# Patient Record
Sex: Male | Born: 1960 | Race: White | Hispanic: No | Marital: Married | Smoking: Never smoker
Health system: Southern US, Community
[De-identification: ages and names within clinical notes are randomized; demographics above are authoritative.]

## PROBLEM LIST (undated history)

## (undated) DIAGNOSIS — T8859XA Other complications of anesthesia, initial encounter: Secondary | ICD-10-CM

## (undated) DIAGNOSIS — T7840XA Allergy, unspecified, initial encounter: Secondary | ICD-10-CM

## (undated) DIAGNOSIS — G709 Myoneural disorder, unspecified: Secondary | ICD-10-CM

## (undated) DIAGNOSIS — M1990 Unspecified osteoarthritis, unspecified site: Secondary | ICD-10-CM

## (undated) DIAGNOSIS — N189 Chronic kidney disease, unspecified: Secondary | ICD-10-CM

## (undated) DIAGNOSIS — F419 Anxiety disorder, unspecified: Secondary | ICD-10-CM

## (undated) DIAGNOSIS — T4145XA Adverse effect of unspecified anesthetic, initial encounter: Secondary | ICD-10-CM

## (undated) DIAGNOSIS — Z87442 Personal history of urinary calculi: Secondary | ICD-10-CM

## (undated) HISTORY — DX: Myoneural disorder, unspecified: G70.9

## (undated) HISTORY — DX: Anxiety disorder, unspecified: F41.9

## (undated) HISTORY — DX: Chronic kidney disease, unspecified: N18.9

## (undated) HISTORY — PX: POLYPECTOMY: SHX149

## (undated) HISTORY — DX: Allergy, unspecified, initial encounter: T78.40XA

---

## 1985-10-16 HISTORY — PX: OTHER SURGICAL HISTORY: SHX169

## 1986-02-17 HISTORY — PX: WRIST SURGERY: SHX841

## 1998-06-12 ENCOUNTER — Emergency Department (HOSPITAL_COMMUNITY): Admission: EM | Admit: 1998-06-12 | Discharge: 1998-06-12 | Payer: Self-pay | Admitting: Emergency Medicine

## 2006-03-26 ENCOUNTER — Emergency Department (HOSPITAL_COMMUNITY): Admission: EM | Admit: 2006-03-26 | Discharge: 2006-03-26 | Payer: Self-pay | Admitting: Family Medicine

## 2006-03-29 ENCOUNTER — Emergency Department (HOSPITAL_COMMUNITY): Admission: EM | Admit: 2006-03-29 | Discharge: 2006-03-29 | Payer: Self-pay | Admitting: Family Medicine

## 2008-12-27 ENCOUNTER — Emergency Department (HOSPITAL_COMMUNITY): Admission: EM | Admit: 2008-12-27 | Discharge: 2008-12-27 | Payer: Self-pay | Admitting: Emergency Medicine

## 2010-04-13 ENCOUNTER — Ambulatory Visit: Payer: Self-pay | Admitting: Internal Medicine

## 2010-04-13 DIAGNOSIS — F329 Major depressive disorder, single episode, unspecified: Secondary | ICD-10-CM | POA: Insufficient documentation

## 2010-04-13 DIAGNOSIS — R079 Chest pain, unspecified: Secondary | ICD-10-CM | POA: Insufficient documentation

## 2010-04-27 ENCOUNTER — Telehealth: Payer: Self-pay | Admitting: Internal Medicine

## 2010-05-01 ENCOUNTER — Encounter: Payer: Self-pay | Admitting: Internal Medicine

## 2010-05-12 ENCOUNTER — Telehealth: Payer: Self-pay | Admitting: Internal Medicine

## 2010-07-23 IMAGING — CT CT HEAD W/O CM
1 of 2 series · 16 of 30 positions shown, 20 images · non-contrast
Comparison: None

CLINICAL DATA: Fell.

CT HEAD WITHOUT CONTRAST
TECHNIQUE: Contiguous axial images were obtained from the base of
the skull through the vertex without contrast.

[Series 3: recon 2: brain · axial · 0.47mm/px · z∈[+148,+291]mm · 16 of 80 slices shown, 20 images]
[im 5/80  brain]
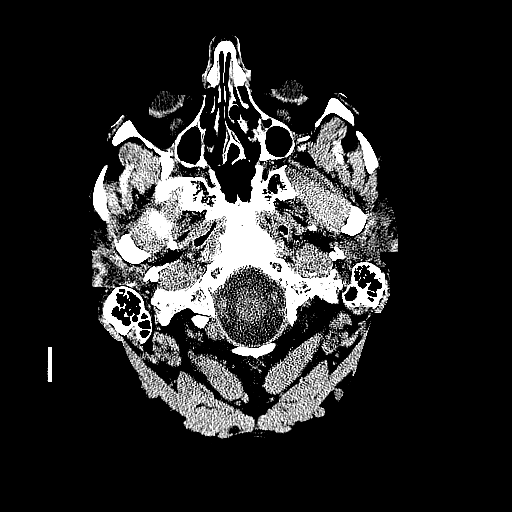
[im 5/80  bone]
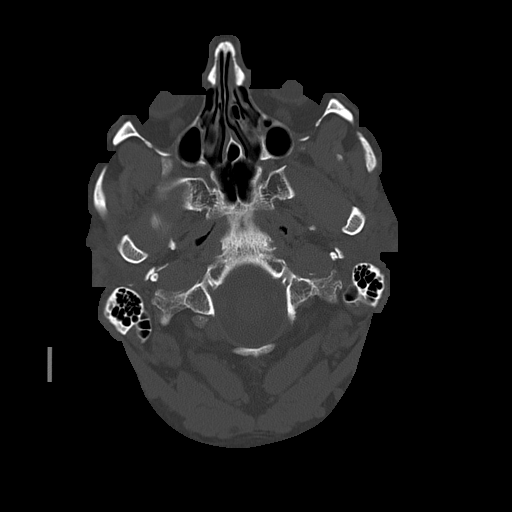
[im 9/80  brain]
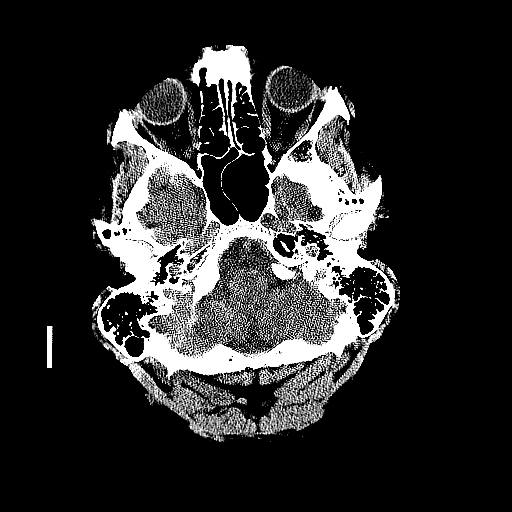
[im 13/80  brain]
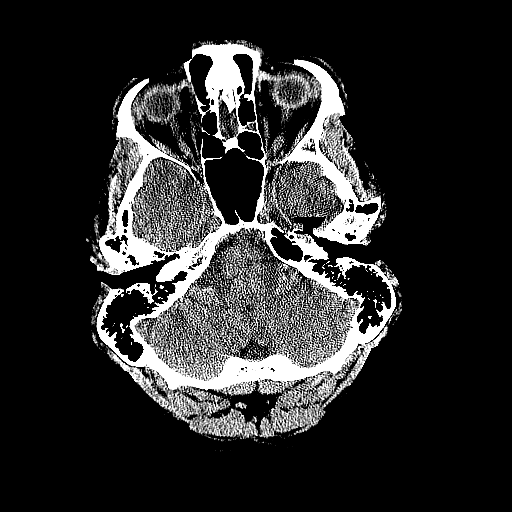
[im 17/80  brain]
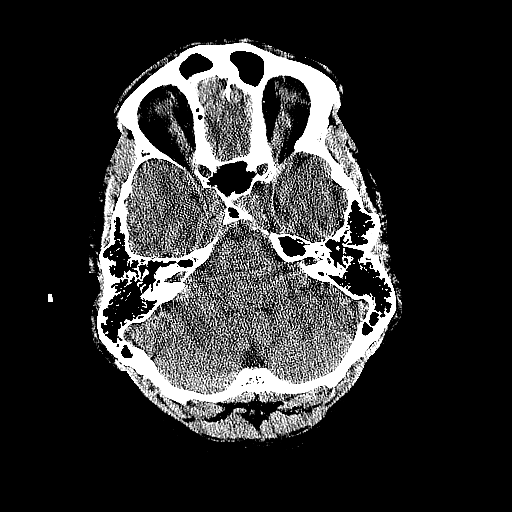
[im 25/80  brain]
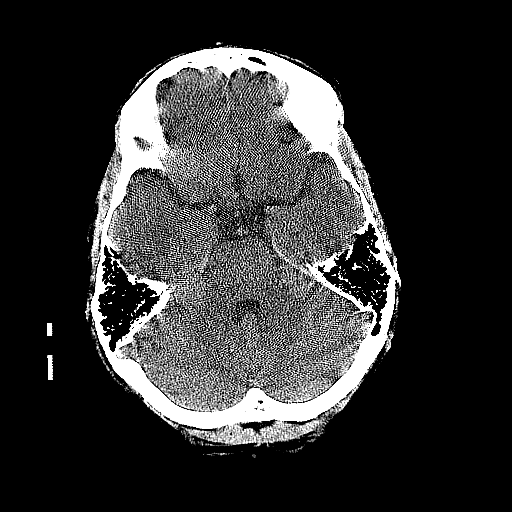
[im 25/80  bone]
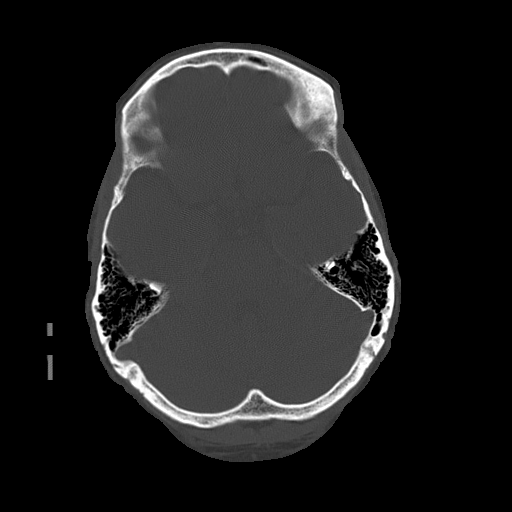
[im 30/80  brain]
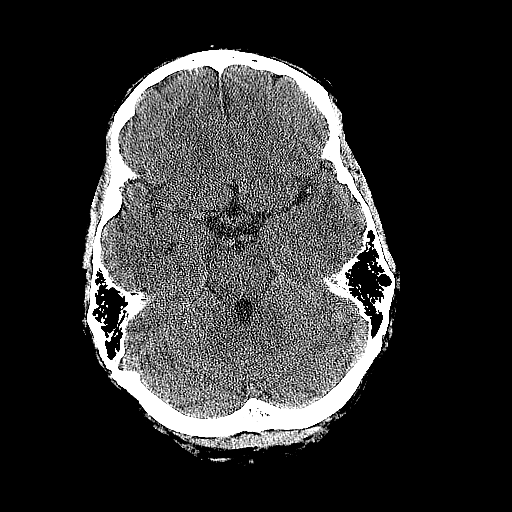
[im 34/80  brain]
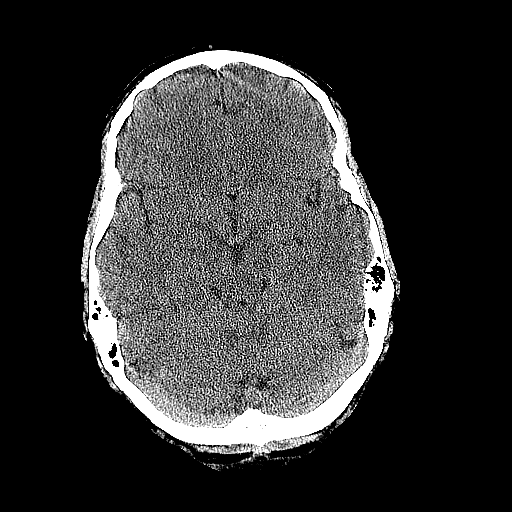
[im 38/80  brain]
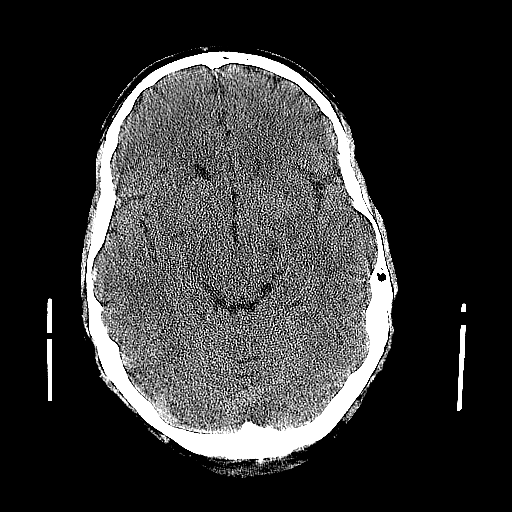
[im 42/80  brain]
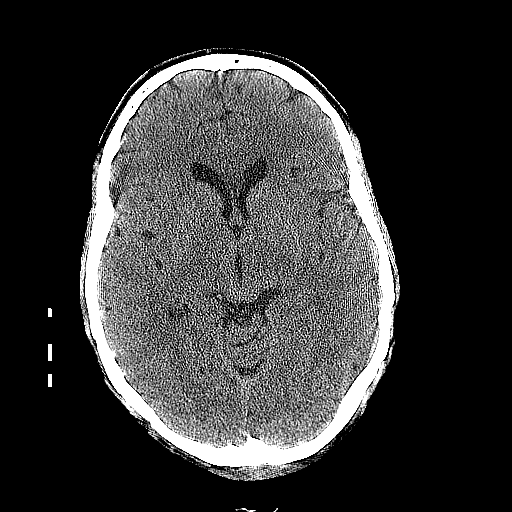
[im 42/80  bone]
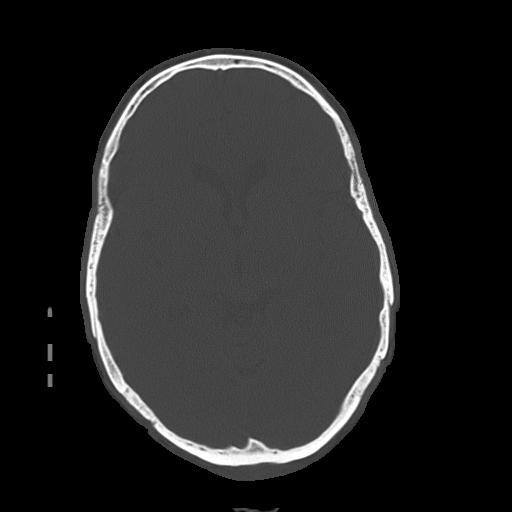
[im 46/80  brain]
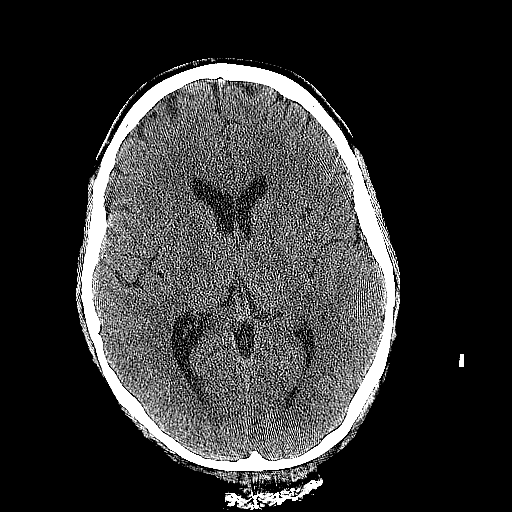
[im 50/80  brain]
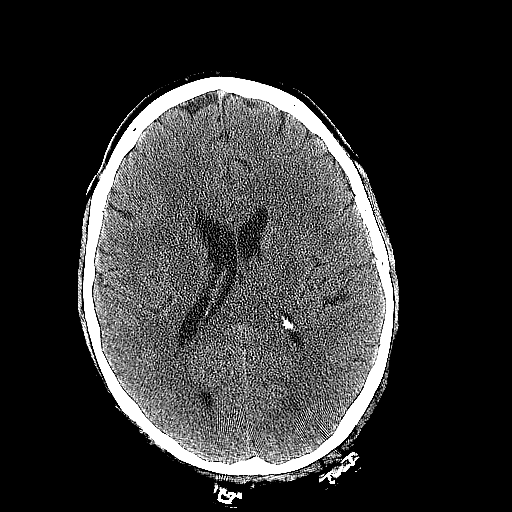
[im 55/80  brain]
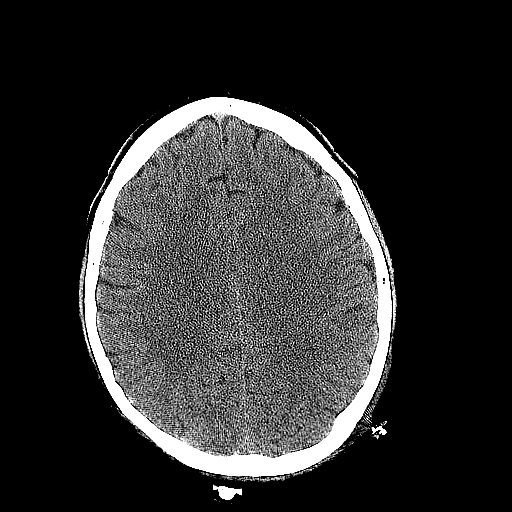
[im 63/80  brain]
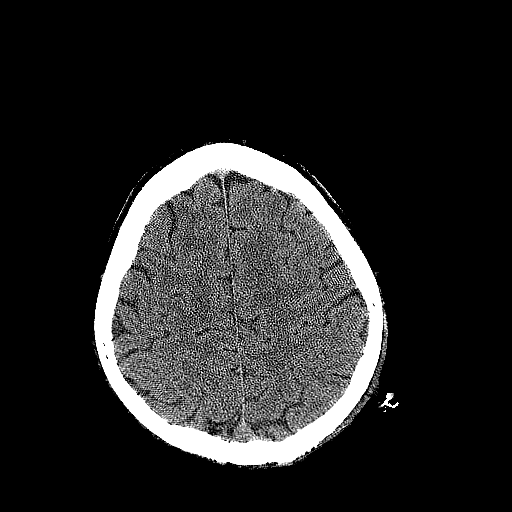
[im 63/80  bone]
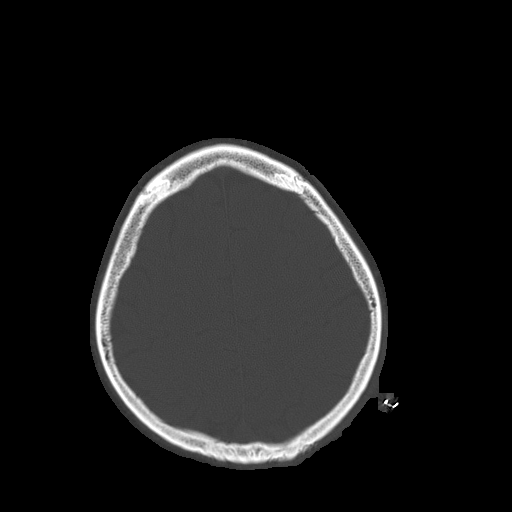
[im 67/80  brain]
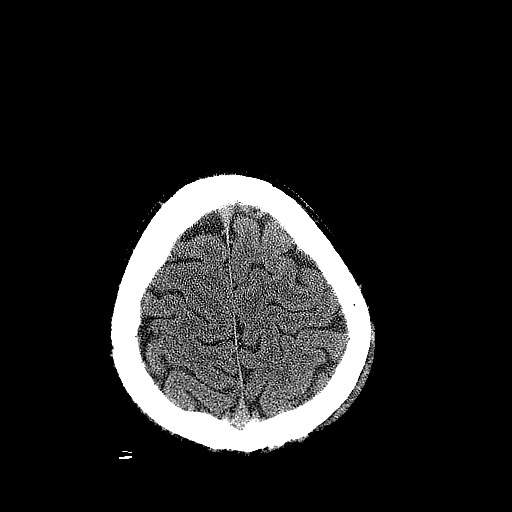
[im 71/80  brain]
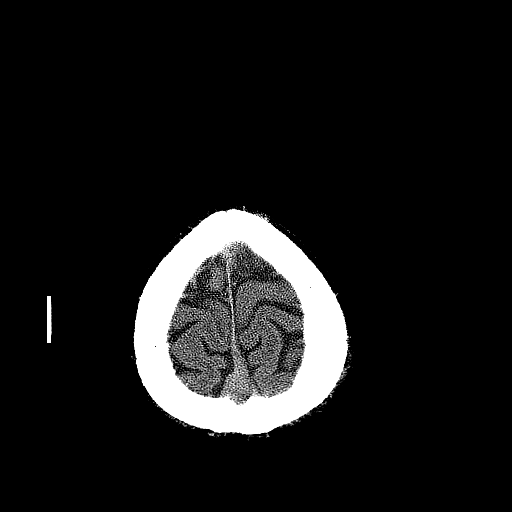
[im 75/80  brain]
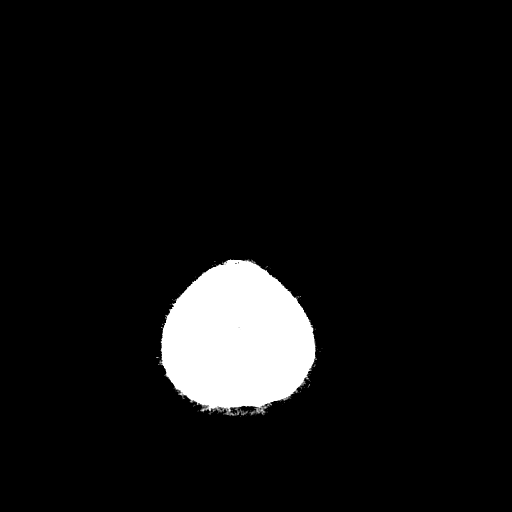

[16 of 30 positions shown; findings below may reference images not displayed]

FINDINGS: The ventricles are normal.  No extra-axial fluid
collections are seen.  The brainstem and cerebellum are
unremarkable.  No acute intracranial findings or mass lesions.

The bony calvarium is intact.  The visualized paranasal sinuses and
mastoid air cells are clear. There is a high left posterior
parietal and post occipital skull laceration.
IMPRESSION: No acute intracranial findings or skull fracture.

## 2010-08-16 ENCOUNTER — Encounter: Payer: Self-pay | Admitting: Internal Medicine

## 2010-11-15 NOTE — Letter (Signed)
Summary: Health Questionaire/[facility]  Health Questionaire/[facility]   Imported By: Sherian Rein 04/14/2010 14:31:59  _____________________________________________________________________  External Attachment:    Type:   Image     Comment:   External Document

## 2010-11-15 NOTE — Letter (Signed)
Summary: [facility] Referral (unable to schedule) Notification  [facility]  [address] [place], [place] [address]   Phone: [phone]  Fax: [phone]      August 16, 2010 Roger Wagner Sep 30, 1961 MRN: [id number]   [facility] [address] [place], [place]  [address]   Dear Dr. Yetta Barre:   Thank you for your kind referral of the above patient. We have attempted to schedule the recommended Colonoscopy but have been unable to schedule because:  _x_ The patient was not available by phone and/or has not returned our calls.  __ The patient declined to schedule the procedure at this time.  We appreciate the referral and hope that we will have the opportunity to treat this patient in the future.    Sincerely,   [facility]  Vania Rea. Jarold Motto M.D. Hedwig Morton. Juanda Chance M.D. Venita Lick. Russella Dar M.D. Wilhemina Bonito. Marina Goodell M.D. Barbette Hair. Arlyce Dice M.D. Iva Boop M.D. Cheron Every.D.

## 2010-11-15 NOTE — Progress Notes (Signed)
Summary: Lab Dx code  Phone Note Call from Patient   Summary of Call: Only one dx was assigned to last labs. What other dx can be used to cover labs. Patient is requesting that we use cpx code. V70.0. OK?   [facility] # [phone] [id number] Initial call taken by: Lamar Sprinkles, CMA,  May 12, 2010 3:47 PM  Follow-up for Phone Call        yes Follow-up by: Etta Grandchild MD,  May 12, 2010 3:51 PM  Additional Follow-up for Phone Call Additional follow up Details #1::        Spoke w/[facility]. They filed last labs w/primary dx 311 and secondary dx v70.0. Should be primary v70.0. They will refile claim but will most likely not change the co-insurance pt has to pay. Pt informed  Additional Follow-up by: Lamar Sprinkles, CMA,  May 13, 2010 6:07 PM

## 2010-11-15 NOTE — Progress Notes (Signed)
Summary: LAB Results  Phone Note Call from Patient Call back at Home Phone [phone] Call back at [phone] - WIFE   Summary of Call: Patient is requesting results of labs. I see order in EMR at date of last office visit. No labs resulted. Need to call pt  to confirm they had labs the day of office visit. Initial call taken by: Lamar Sprinkles, CMA,  April 27, 2010 3:16 PM  Follow-up for Phone Call        Left vm for pt to call back and confirm when & where he had labs. ............Marland KitchenLamar Sprinkles, CMA  April 27, 2010 3:34 PM   Pt called back. They had labs thru [facility] and were supposed to fax results. Will need to contact [facility]. ........Marland KitchenLamar Sprinkles, CMA  April 28, 2010 12:03 PM   Called [facility], they will fax results from 04/13/10 to side a fax Follow-up by: Lamar Sprinkles, CMA,  April 28, 2010 4:21 PM  Additional Follow-up for Phone Call Additional follow up Details #1::        Labs recieved. Pt aware MD will review when he returns to the office Monday................Marland KitchenLamar Sprinkles, CMA  April 28, 2010 5:21 PM

## 2010-11-15 NOTE — Letter (Signed)
Summary: Results Follow-up Letter  [facility]  [address] [place], [place] [address]   Phone: [phone]  Fax: [phone]    05/01/2010  [address] [place], [place]  [address]  Dear Mr. Adel,   The following are the results of your recent test(s):  Test     Result     CBC       normal Liver/kidney   normal Thyroid     normal Prostate     normal   _________________________________________________________  Please call for an appointment as needed _________________________________________________________ _________________________________________________________ _________________________________________________________  Sincerely,  Sanda Linger MD [facility]

## 2010-11-15 NOTE — Assessment & Plan Note (Signed)
Summary: NEW [employer] PT # PKG/OFF--STC   Vital Signs:  Patient profile:   50 year old male Height:      72 inches Weight:      195 pounds BMI:     26.54 O2 Sat:      97 % on Room air Temp:     98.2 degrees F oral Pulse rate:   62 / minute Pulse rhythm:   regular Resp:     16 per minute BP sitting:   102 / 70  (left arm) Cuff size:   large  Vitals Entered By: Rock Nephew CMA (April 13, 2010 10:37 AM)  Nutrition Counseling: Patient's BMI is greater than 25 and therefore counseled on weight management options.  O2 Flow:  Room air CC: L side arm and chest pain, rhinks anxiety and stress Is Patient Diabetic? No   Primary Care Provider:  Etta Grandchild MD  CC:  L side arm and chest pain and rhinks anxiety and stress.  History of Present Illness:       This is a 50 year old male who presents with Chest pain.  The symptoms began 4-8 weeks ago.  On a scale of 1 to 10, the intensity is described as a 2.  The patient reports resting chest pain, but denies exertional chest pain, nausea, vomiting, diaphoresis, shortness of breath, palpitations, dizziness, light headedness, syncope, and indigestion.  The pain is described as intermittent and dull.  The pain is located in the left anterior chest and the pain does not radiate.  Episodes of chest pain last 2-5 minutes.  The pain is brought on or made worse by emotional stress.  His wife has given him a few doses of her mother's xanax and that has helped with the chest pain and anxiety.  Preventive Screening-Counseling & Management  Alcohol-Tobacco     Alcohol drinks/day: <1     Alcohol type: beer     >5/day in last 3 mos: no     Alcohol Counseling: not indicated; use of alcohol is not excessive or problematic     Feels need to cut down: no     Feels annoyed by complaints: no     Feels guilty re: drinking: no     Needs 'eye opener' in am: no     Smoking Status: never  Caffeine-Diet-Exercise     Does Patient Exercise: yes     Type of  exercise: running     Exercise (avg: min/session): 30-60     Times/week: 4     Exercise Counseling: not indicated; exercise is adequate     PHQ-9 Score: 1-4 minimal depression  Hep-HIV-STD-Contraception     TSE monthly: yes     Testicular SE Education/Counseling to perform regular STE     Sun Exposure-Excessive: yes     Sun Exposure Counseling: to decrease sun exposure  Safety-Violence-Falls     Seat Belt Use: yes     Helmet Use: yes     Firearms in the Home: no firearms in the home     Smoke Detectors: yes     Violence in the Home: no risk noted     Sexual Abuse: no      Sexual History:  currently monogamous.        Drug Use:  never and no.        Blood Transfusions:  no.    Current Medications (verified): 1)  None  Allergies (verified): No Known Drug Allergies  Past History:  Past Medical History: Unremarkable  Past Surgical History: Denies surgical history  Family History: Family History of Colon CA 1st degree relative <60 Family History Diabetes 1st degree relative Family History of Stroke F 1st degree relative <60  Social History: Occupation: Airline pilot Married Never Smoked Alcohol use-yes Drug use-no Regular exercise-yes Smoking Status:  never Does Patient Exercise:  yes Drug Use:  never, no Sun Exposure-Excessive:  yes Seat Belt Use:  yes Sexual History:  currently monogamous Blood Transfusions:  no  Review of Systems       The patient complains of chest pain.  The patient denies anorexia, fever, weight loss, weight gain, syncope, dyspnea on exertion, peripheral edema, prolonged cough, headaches, hemoptysis, abdominal pain, hematochezia, hematuria, suspicious skin lesions, enlarged lymph nodes, angioedema, and testicular masses.   Psych:  Complains of anxiety, depression, irritability, and panic attacks; denies easily angered, easily tearful, sense of great danger, suicidal thoughts/plans, thoughts of violence, unusual visions or sounds, and thoughts  /plans of harming others.  Physical Exam  General:  alert, well-developed, well-nourished, well-hydrated, appropriate dress, normal appearance, healthy-appearing, cooperative to examination, and good hygiene.   Head:  normocephalic, atraumatic, no abnormalities observed, and no abnormalities palpated.   Eyes:  vision grossly intact and no injection.   Ears:  R ear normal and L ear normal.   Mouth:  Oral mucosa and oropharynx without lesions or exudates.  Teeth in good repair. Neck:  supple, full ROM, no masses, no thyromegaly, no thyroid nodules or tenderness, no JVD, normal carotid upstroke, no carotid bruits, no cervical lymphadenopathy, and no neck tenderness.   Chest Wall:  No deformities, masses, tenderness or gynecomastia noted. Breasts:  No masses or gynecomastia noted Lungs:  Normal respiratory effort, chest expands symmetrically. Lungs are clear to auscultation, no crackles or wheezes. Heart:  Normal rate and regular rhythm. S1 and S2 normal without gallop, murmur, click, rub or other extra sounds. Abdomen:  Bowel sounds positive,abdomen soft and non-tender without masses, organomegaly or hernias noted. Rectal:  No external abnormalities noted. Normal sphincter tone. No rectal masses or tenderness. Genitalia:  Testes bilaterally descended without nodularity, tenderness or masses. No scrotal masses or lesions. No penis lesions or urethral discharge. Prostate:  Prostate gland firm and smooth, no enlargement, nodularity, tenderness, mass, asymmetry or induration. Msk:  No deformity or scoliosis noted of thoracic or lumbar spine.   Pulses:  R and L carotid,radial,femoral,dorsalis pedis and posterior tibial pulses are full and equal bilaterally Extremities:  No clubbing, cyanosis, edema, or deformity noted with normal full range of motion of all joints.   Neurologic:  No cranial nerve deficits noted. Station and gait are normal. Plantar reflexes are down-going bilaterally. DTRs are  symmetrical throughout. Sensory, motor and coordinative functions appear intact. Skin:  turgor normal, no rashes, no suspicious lesions, no ecchymoses, no petechiae, no purpura, no ulcerations, no edema, excessive tan, and solar damage.   Cervical Nodes:  no anterior cervical adenopathy and no posterior cervical adenopathy.   Axillary Nodes:  no R axillary adenopathy and no L axillary adenopathy.   Inguinal Nodes:  no R inguinal adenopathy and no L inguinal adenopathy.   Psych:  Oriented X3, memory intact for recent and remote, normally interactive, good eye contact, not depressed appearing, not agitated, not suicidal, not homicidal, and slightly anxious.   Additional Exam:  EKG is normal.   Impression & Recommendations:  Problem # 1:  CHEST PAIN (ICD-786.50) Assessment New there is no indication that this is cardiac, will treat anxiety  and follow Orders: EKG w/ Interpretation (93000)  Problem # 2:  DEPRESSIVE DISORDER (ICD-311) Assessment: New  His updated medication list for this problem includes:    Zoloft 50 Mg Tabs (Sertraline hcl) ..... One by mouth at bedtime    Klonopin 1 Mg Tabs (Clonazepam) .Marland Kitchen... 1/2-1 by mouth two times a day as needed for anxiety  Orders: Venipuncture ([address]) TLB-Lipid Panel (80061-LIPID) TLB-BMP (Basic Metabolic Panel-BMET) (80048-METABOL) TLB-CBC Platelet - w/Differential (85025-CBCD) TLB-Hepatic/Liver Function Pnl (80076-HEPATIC) TLB-TSH (Thyroid Stimulating Hormone) (84443-TSH) TLB-PSA (Prostate Specific Antigen) (84153-PSA)  Discussed treatment options, including trial of antidpressant medication. Will refer to behavioral health. Follow-up call in in 24-48 hours and recheck in 2 weeks, sooner as needed. Patient agrees to call if any worsening of symptoms or thoughts of doing harm arise. Verified that the patient has no suicidal ideation at this time.   Problem # 3:  ROUTINE GENERAL MEDICAL EXAM@[email or web address]  CARE FACL (ICD-V70.0) Assessment:  New  Orders: Venipuncture ([address]) TLB-Lipid Panel (80061-LIPID) TLB-BMP (Basic Metabolic Panel-BMET) (80048-METABOL) TLB-CBC Platelet - w/Differential (85025-CBCD) TLB-Hepatic/Liver Function Pnl (80076-HEPATIC) TLB-TSH (Thyroid Stimulating Hormone) (84443-TSH) TLB-PSA (Prostate Specific Antigen) (84153-PSA) Gastroenterology Referral (GI)  Td Booster: Tdap (12/14/2008)    Discussed using sunscreen, use of alcohol, drug use, self testicular exam, routine dental care, routine eye care, routine physical exam, seat belts, multiple vitamins,  and recommendations for immunizations.  Discussed exercise and checking cholesterol.  Discussed gun safety, safe sex, and contraception. Also recommend checking PSA.  Complete Medication List: 1)  Zoloft 50 Mg Tabs (Sertraline hcl) .... One by mouth at bedtime 2)  Klonopin 1 Mg Tabs (Clonazepam) .... 1/2-1 by mouth two times a day as needed for anxiety  Patient Instructions: 1)  Please schedule a follow-up appointment in 2 months. Prescriptions: KLONOPIN 1 MG TABS (CLONAZEPAM) 1/2-1 by mouth two times a day as needed for anxiety  #50 x 2   Entered and Authorized by:   Etta Grandchild MD   Signed by:   Etta Grandchild MD on 04/13/2010   Method used:   Print then Give to Patient   RxID:   [id number] ZOLOFT 50 MG TABS (SERTRALINE HCL) One by mouth at bedtime  #30 x 11   Entered and Authorized by:   Etta Grandchild MD   Signed by:   Etta Grandchild MD on 04/13/2010   Method used:   Print then Give to Patient   RxID:   [id number]   Preventive Care Screening  Last Tetanus Booster:    Date:  12/14/2008    Results:  Tdap

## 2010-11-15 NOTE — Letter (Signed)
Summary: Lipid Letter  [facility]  [address] [place], [place] [address]   Phone: [phone]  Fax: [phone]    05/01/2010  Roger Wagner [address] [place], [place]  [address]  Dear Roger Wagner:  We have carefully reviewed your last lipid profile from  and the results are noted below with a summary of recommendations for lipid management.    Cholesterol:       189     Goal: <200   HDL "good" Cholesterol:   57     Goal: >40   LDL "bad" Cholesterol:   110     Goal: <130   Triglycerides:       111     Goal: <150    Excellent results!!!!!!    TLC Diet (Therapeutic Lifestyle Change): Saturated Fats & Transfatty acids should be kept < 7% of total calories ***Reduce Saturated Fats Polyunstaurated Fat can be up to 10% of total calories Monounsaturated Fat Fat can be up to 20% of total calories Total Fat should be no greater than 25-35% of total calories Carbohydrates should be 50-60% of total calories Protein should be approximately 15% of total calories Fiber should be at least 20-30 grams a day ***Increased fiber may help lower LDL Total Cholesterol should be < 200mg /day Consider adding plant stanol/sterols to diet (example: Benacol spread) ***A higher intake of unsaturated fat may reduce Triglycerides and Increase HDL    Adjunctive Measures (may lower LIPIDS and reduce risk of Heart Attack) include: Aerobic Exercise (20-30 minutes 3-4 times a week) Limit Alcohol Consumption Weight Reduction Aspirin 75-81 mg a day by mouth (if not allergic or contraindicated) Dietary Fiber 20-30 grams a day by mouth     Current Medications: 1)    Zoloft 50 Mg Tabs (Sertraline hcl) .... One by mouth at bedtime 2)    Klonopin 1 Mg Tabs (Clonazepam) .... 1/2-1 by mouth two times a day as needed for anxiety  If you have any questions, please call. We appreciate being able to work with you.   Sincerely,    [facility] Etta Grandchild MD

## 2010-11-29 ENCOUNTER — Encounter: Payer: Self-pay | Admitting: Internal Medicine

## 2010-11-29 ENCOUNTER — Ambulatory Visit (INDEPENDENT_AMBULATORY_CARE_PROVIDER_SITE_OTHER): Payer: PRIVATE HEALTH INSURANCE | Admitting: Internal Medicine

## 2010-11-29 DIAGNOSIS — K612 Anorectal abscess: Secondary | ICD-10-CM

## 2010-11-30 ENCOUNTER — Encounter: Payer: Self-pay | Admitting: Internal Medicine

## 2010-11-30 ENCOUNTER — Ambulatory Visit (INDEPENDENT_AMBULATORY_CARE_PROVIDER_SITE_OTHER): Payer: PRIVATE HEALTH INSURANCE | Admitting: Internal Medicine

## 2010-11-30 ENCOUNTER — Ambulatory Visit: Payer: Self-pay | Admitting: Internal Medicine

## 2010-11-30 DIAGNOSIS — K612 Anorectal abscess: Secondary | ICD-10-CM

## 2010-11-30 DIAGNOSIS — F329 Major depressive disorder, single episode, unspecified: Secondary | ICD-10-CM

## 2010-12-07 NOTE — Assessment & Plan Note (Signed)
Summary: ??boil on rectum   Vital Signs:  Patient profile:   50 year old male Height:      72 inches Weight:      198 pounds BMI:     26.95 O2 Sat:      97 % on Room air Temp:     98.4 degrees F oral Pulse rate:   73 / minute Pulse rhythm:   regular Resp:     16 per minute BP sitting:   118 / 70  (left arm) Cuff size:   large  Vitals Entered By: Rock Nephew CMA (November 29, 2010 10:07 AM)  O2 Flow:  Room air CC: painful bump on anus Is Patient Diabetic? No Pain Assessment Patient in pain? yes     Location: pelvis   Primary Care Provider:  Etta Grandchild MD  CC:  painful bump on anus.  History of Present Illness: He returns c/o a painful lump around his anus for 3 days. He has had something similar in the past and it was an abscess that was drained. He feels like this has recurred in the same area.  Preventive Screening-Counseling & Management  Alcohol-Tobacco     Alcohol drinks/day: <1     Alcohol type: beer     >5/day in last 3 mos: no     Alcohol Counseling: not indicated; use of alcohol is not excessive or problematic     Feels need to cut down: no     Feels annoyed by complaints: no     Feels guilty re: drinking: no     Needs 'eye opener' in am: no     Smoking Status: never     Tobacco Counseling: not indicated; no tobacco use  Hep-HIV-STD-Contraception     Hepatitis Risk: no risk noted     HIV Risk: no risk noted     STD Risk: no risk noted     TSE monthly: yes     Testicular SE Education/Counseling to perform regular STE     Sun Exposure-Excessive: yes     Sun Exposure Counseling: to decrease sun exposure      Sexual History:  currently monogamous.        Drug Use:  never and no.        Blood Transfusions:  no.    Medications Prior to Update: 1)  Zoloft 50 Mg Tabs (Sertraline Hcl) .... One By Mouth At Bedtime 2)  Klonopin 1 Mg Tabs (Clonazepam) .... 1/2-1 By Mouth Two Times A Day As Needed For Anxiety  Current Medications (verified): 1)   Zoloft 50 Mg Tabs (Sertraline Hcl) .... One By Mouth At Bedtime 2)  Klonopin 1 Mg Tabs (Clonazepam) .... 1/2-1 By Mouth Two Times A Day As Needed For Anxiety 3)  Augmentin 500-125 Mg Tabs (Amoxicillin-Pot Clavulanate) .... One By Mouth Three Times A Day For 7 Days 4)  Percocet 7.5-325 Mg Tabs (Oxycodone-Acetaminophen) .... One By Mouth Qid As Needed For Pain 5)  Promethazine Hcl 25 Mg Tabs (Promethazine Hcl) .... 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 Family History of Colon CA 1st degree relative <60 Family History Diabetes 1st degree relative Family History of Stroke F 1st degree relative <60  Social History: Reviewed history from 04/13/2010 and no changes required. 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     Physical Exam  General:  alert, well-developed, well-nourished, well-hydrated, appropriate dress, normal appearance, healthy-appearing, cooperative to examination, and good hygiene.   Head:  normocephalic, atraumatic, no abnormalities observed, and no abnormalities palpated.   Eyes:  vision grossly intact and no injection.   Neck:  supple, full ROM, no masses, no thyromegaly, no thyroid nodules or tenderness, no JVD, normal carotid upstroke, no carotid bruits, no cervical lymphadenopathy, and no neck tenderness.   Lungs:  Normal respiratory effort, chest expands symmetrically. Lungs are clear to auscultation, no crackles or wheezes. Heart:  Normal rate and regular rhythm. S1 and S2 normal without gallop, murmur, click, rub or other extra sounds. Abdomen:  Bowel sounds positive,abdomen soft and non-tender without masses, organomegaly or hernias noted. Rectal:  there is an erythematous protrusion noted at the upper/posterior/left perianal area outside the area. there is fluctuance and ttp. there are no tracts or pores. the digital rectal exam does not show any involvement of the canal with no ttp, erythema, mass, blood, exudate Genitalia:  Testes bilaterally descended without nodularity, tenderness or masses. No scrotal masses or lesions. No penis lesions or urethral discharge. Msk:  No deformity or scoliosis noted of thoracic or lumbar spine.   Skin:  turgor normal, no rashes, no suspicious lesions, no ecchymoses, no petechiae, no purpura, no ulcerations, no edema, excessive tan, and solar damage.   Psych:  Oriented X3, memory intact for recent and remote, normally interactive, good eye contact, not depressed appearing, not agitated, not suicidal, not homicidal, and slightly anxious.   Additional Exam:  the perianal area was cleaned with betadine then prepped and draped in sterile fashion. local anesthesia was obtained with 2% plain lidocaine, when anesthesia was confirmed an 11-blade was used to  make an incision and a copious amount of purulent exudate was released, the incision was explored and a loculation was found and it was irrigated with H2O2 then packed with iodoform. He tolerated all of this well. there was very minimal blood loss. sterile gauze was placed over the wound and hemostasis was obtained.   Impression & Recommendations:  Problem # 1:  ABSCESS-ANORECTAL (ICD-566) Assessment New start augmentin and control pain and side effects Orders: T-Culture, Wound (87070/[id number]) Specimen Handling (16109) I&D Abscess, Complex (10061)  Complete Medication List: 1)  Zoloft 50 Mg Tabs (Sertraline hcl) .... One by mouth at bedtime 2)  Klonopin 1 Mg Tabs (Clonazepam) .... 1/2-1 by mouth two times a day as needed for anxiety 3)  Augmentin 500-125 Mg Tabs (Amoxicillin-pot clavulanate) .... One by mouth three times a day for 7 days 4)  Percocet 7.5-325 Mg Tabs (Oxycodone-acetaminophen) .... One by mouth qid as needed  for pain 5)  Promethazine Hcl 25 Mg Tabs (Promethazine hcl) .... 1/2-1 by mouth three times a day as needed for nausea or vomiting  Patient Instructions: 1)  Please schedule a follow-up appointment in 1 day for a recheck. 2)  Take your antibiotic as prescribed until ALL of it is gone, but stop if you develop a rash or swelling and contact our office as soon as possible. Prescriptions: PROMETHAZINE HCL 25 MG TABS (PROMETHAZINE HCL) 1/2-1 by mouth three times a day as needed for nausea or vomiting  #20 x 0   Entered and Authorized by:   Etta Grandchild MD   Signed by:   Etta Grandchild MD on 11/29/2010   Method used:   Print then Give to Patient   RxID:   [id number] PERCOCET 7.5-325 MG TABS (OXYCODONE-ACETAMINOPHEN) One by mouth QID as needed for pain  #20 x 0   Entered and Authorized by:   Etta Grandchild MD   Signed by:   Etta Grandchild MD on 11/29/2010   Method used:   Print then Give to Patient   RxID:   [id number] AUGMENTIN 500-125 MG TABS  (AMOXICILLIN-POT CLAVULANATE) One by mouth three times a day for 7 days  #21 x 0   Entered and Authorized by:   Etta Grandchild MD   Signed by:   Etta Grandchild MD on 11/29/2010   Method used:   Print then Give to Patient   RxID:   [id number]    Orders Added: 1)  T-Culture, Wound [87070/[id number]] 2)  Specimen Handling [99000] 3)  I&D Abscess, Complex [10061] 4)  Est. Patient Level III [53664]

## 2010-12-07 NOTE — Assessment & Plan Note (Signed)
Summary: 1 DAY RECHECK/NWS   Vital Signs:  Patient profile:   50 year old male Height:      72 inches Weight:      200 pounds O2 Sat:      98 % on Room air Temp:     98.0 degrees F oral Pulse rate:   56 / minute Pulse rhythm:   regular Resp:     16 per minute BP sitting:   132 / 82  (left arm) Cuff size:   regular  Vitals Entered By: Lamar Sprinkles, CMA (November 30, 2010 9:52 AM)  O2 Flow:  Room air CC: F/up Is Patient Diabetic? No Pain Assessment Patient in pain? no        Primary Care Provider:  Etta Grandchild MD  CC:  F/up.  History of Present Illness: He returns for f/up and tells me that he is doing much better with no pain, swelling, redness, bleeding, drainage, fever, or chills.  Preventive Screening-Counseling & Management  Alcohol-Tobacco     Alcohol drinks/day: <1     Alcohol type: beer     >5/day in last 3 mos: no     Alcohol Counseling: not indicated; use of alcohol is not excessive or problematic     Feels need to cut down: no     Feels annoyed by complaints: no     Feels guilty re: drinking: no     Needs 'eye opener' in am: no     Smoking Status: never     Tobacco Counseling: not indicated; no tobacco use  Hep-HIV-STD-Contraception     Hepatitis Risk: no risk noted     HIV Risk: no risk noted     STD Risk: no risk noted     TSE monthly: yes     Testicular SE Education/Counseling to perform regular STE     Sun Exposure-Excessive: yes     Sun Exposure Counseling: to decrease sun exposure      Sexual History:  currently monogamous.        Drug Use:  never and no.        Blood Transfusions:  no.    Medications Prior to Update: 1)  Zoloft 50 Mg Tabs (Sertraline Hcl) .... One By Mouth At Bedtime 2)  Klonopin 1 Mg Tabs (Clonazepam) .... 1/2-1 By Mouth Two Times A Day As Needed For Anxiety 3)  Augmentin 500-125 Mg Tabs (Amoxicillin-Pot Clavulanate) .... One By Mouth Three Times A Day For 7 Days 4)  Percocet 7.5-325 Mg Tabs  (Oxycodone-Acetaminophen) .... One By Mouth Qid As Needed For Pain 5)  Promethazine Hcl 25 Mg Tabs (Promethazine Hcl) .... 1/2-1 By Mouth Three Times A Day As Needed For Nausea or Vomiting  Current Medications (verified): 1)  Zoloft 50 Mg Tabs (Sertraline Hcl) .... One By Mouth At Bedtime 2)  Klonopin 1 Mg Tabs (Clonazepam) .... 1/2-1 By Mouth Two Times A Day As Needed For Anxiety 3)  Augmentin 500-125 Mg Tabs (Amoxicillin-Pot Clavulanate) .... One By Mouth Three Times A Day For 7 Days 4)  Percocet 7.5-325 Mg Tabs (Oxycodone-Acetaminophen) .... One By Mouth Qid As Needed For Pain 5)  Promethazine Hcl 25 Mg Tabs (Promethazine Hcl) .... 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 Family History of Colon CA 1st degree relative <60 Family History Diabetes 1st degree relative Family History of Stroke F 1st degree relative <60  Social History: Reviewed history from 04/13/2010 and no changes required. 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   Psych:  Complains of anxiety; denies alternate hallucination ( auditory/visual), depression, easily angered, easily tearful, irritability, mental problems, panic attacks, sense of great danger, suicidal thoughts/plans, thoughts of violence, unusual visions or sounds, and thoughts /plans of harming others.  Physical Exam  General:  alert, well-developed, well-nourished, well-hydrated, appropriate dress, normal appearance, healthy-appearing, cooperative to examination, and good hygiene.   Mouth:  Oral mucosa and oropharynx without lesions or exudates.  Teeth in good repair. Neck:  supple, full ROM, no masses, no thyromegaly, no thyroid nodules or tenderness, no JVD, normal carotid upstroke, no carotid bruits, no cervical lymphadenopathy, and no neck tenderness.   Lungs:  Normal respiratory effort, chest expands symmetrically. Lungs are clear to auscultation, no crackles or wheezes. Heart:  Normal rate and regular rhythm. S1 and S2 normal without gallop, murmur, click, rub or other extra sounds. Abdomen:  Bowel sounds positive,abdomen soft and non-tender without masses, organomegaly or hernias noted. Rectal:  there is small incision that is nearly completed closed at the upper/posterior/left perianal area outside the area. there is no fluctuance or  ttp. there are no tracts or pores.  the area od prior abscess formation has resloved and there is no iodoform remaining. the digital rectal exam does not show any involvement of the canal with no ttp, erythema, mass, blood, exudate Msk:  No deformity or scoliosis noted of thoracic or lumbar spine.   Extremities:  No clubbing, cyanosis, edema, or deformity noted with normal full range of motion of all joints.   Neurologic:  No cranial nerve deficits noted. Station and gait are normal. Plantar reflexes are down-going bilaterally. DTRs are symmetrical throughout. Sensory, motor and coordinative functions appear  intact. Skin:  turgor normal, no rashes, no suspicious lesions, no ecchymoses, no petechiae, no purpura, no ulcerations, no edema, excessive tan, and solar damage.   Cervical Nodes:  no anterior cervical adenopathy and no posterior cervical adenopathy.   Inguinal Nodes:  no R inguinal adenopathy and no L inguinal adenopathy.   Psych:  Oriented X3, memory intact for recent and remote, normally interactive, good eye contact, not anxious appearing, not depressed appearing, not agitated, not suicidal, and not homicidal.     Impression & Recommendations:  Problem # 1:  ABSCESS-ANORECTAL (ICD-566) Assessment Improved continue same, start sitz baths two times a day   Problem # 2:  DEPRESSIVE DISORDER (ICD-311) Assessment: Unchanged  His updated medication list for this problem includes:    Zoloft 50 Mg Tabs (Sertraline hcl) ..... One by mouth at bedtime    Klonopin 1 Mg Tabs (Clonazepam) .Marland Kitchen... 1/2-1 by mouth two times a day as needed for anxiety  Complete Medication List: 1)  Zoloft  50 Mg Tabs (Sertraline hcl) .... One by mouth at bedtime 2)  Klonopin 1 Mg Tabs (Clonazepam) .... 1/2-1 by mouth two times a day as needed for anxiety 3)  Augmentin 500-125 Mg Tabs (Amoxicillin-pot clavulanate) .... One by mouth three times a day for 7 days 4)  Percocet 7.5-325 Mg Tabs (Oxycodone-acetaminophen) .... One by mouth qid as needed for pain 5)  Promethazine Hcl 25 Mg Tabs (Promethazine hcl) .... 1/2-1 by mouth three times a day as needed for nausea or vomiting  Patient Instructions: 1)  Please schedule a follow-up appointment in 1 month. 2)  Take your antibiotic as prescribed until ALL of it is gone, but stop if you develop a rash or swelling and contact our office as soon as possible. Prescriptions: KLONOPIN 1 MG TABS (CLONAZEPAM) 1/2-1 by mouth two times a day as needed for anxiety  #50 x 2   Entered and Authorized by:   Etta Grandchild MD   Signed by:   Etta Grandchild MD on 11/30/2010   Method  used:   Print then Give to Patient   RxID:   [id number]    Orders Added: 1)  Est. Patient Level III [14782]

## 2011-07-03 ENCOUNTER — Telehealth: Payer: Self-pay

## 2011-07-03 MED ORDER — AZITHROMYCIN 500 MG PO TABS: 500.0000 mg | ORAL_TABLET | Freq: Every day | ORAL | Status: DC

## 2011-07-03 MED ORDER — AZITHROMYCIN 500 MG PO TABS: 500.0000 mg | ORAL_TABLET | Freq: Every day | ORAL | Status: AC

## 2011-07-03 NOTE — Telephone Encounter (Signed)
Wife notified.

## 2011-07-03 NOTE — Telephone Encounter (Signed)
done

## 2011-07-03 NOTE — Telephone Encounter (Signed)
Wife called previously about son and received advisement from MD. I called back to inform rx sent in, she then requested if the same zpak can be sent in for her husband as well. Please advise if ok

## 2011-08-29 ENCOUNTER — Other Ambulatory Visit: Payer: Self-pay | Admitting: Internal Medicine

## 2011-09-01 ENCOUNTER — Other Ambulatory Visit: Payer: Self-pay | Admitting: Internal Medicine

## 2011-09-01 NOTE — Telephone Encounter (Signed)
RX faxed in 

## 2011-09-01 NOTE — Telephone Encounter (Signed)
Pt called and stated he needed a refill of Klonopin.  He has a cpe scheduled for 12/7  Thanks!

## 2011-09-13 ENCOUNTER — Other Ambulatory Visit: Payer: Self-pay | Admitting: *Deleted

## 2011-09-13 MED ORDER — CLONAZEPAM 1 MG PO TABS: ORAL_TABLET | ORAL | Status: DC

## 2011-09-13 NOTE — Progress Notes (Signed)
Per patient request, new pharmacy. Pt informed.

## 2011-09-21 ENCOUNTER — Encounter: Payer: Self-pay | Admitting: Internal Medicine

## 2011-09-22 ENCOUNTER — Encounter: Payer: Self-pay | Admitting: Internal Medicine

## 2011-09-22 ENCOUNTER — Ambulatory Visit (INDEPENDENT_AMBULATORY_CARE_PROVIDER_SITE_OTHER): Payer: PRIVATE HEALTH INSURANCE | Admitting: Internal Medicine

## 2011-09-22 ENCOUNTER — Other Ambulatory Visit (INDEPENDENT_AMBULATORY_CARE_PROVIDER_SITE_OTHER): Payer: PRIVATE HEALTH INSURANCE

## 2011-09-22 VITALS — BP 122/76 | HR 68 | Temp 97.8°F | Resp 16 | Wt 190.0 lb

## 2011-09-22 DIAGNOSIS — Z0000 Encounter for general adult medical examination without abnormal findings: Secondary | ICD-10-CM | POA: Insufficient documentation

## 2011-09-22 LAB — COMPREHENSIVE METABOLIC PANEL
ALT: 29 U/L (ref 0–53)
AST: 30 U/L (ref 0–37)
Alkaline Phosphatase: 76 U/L (ref 39–117)
Sodium: 140 mEq/L (ref 135–145)
Total Bilirubin: 0.7 mg/dL (ref 0.3–1.2)
Total Protein: 7 g/dL (ref 6.0–8.3)

## 2011-09-22 LAB — CBC WITH DIFFERENTIAL/PLATELET
Basophils Absolute: 0 10*3/uL (ref 0.0–0.1)
Eosinophils Absolute: 0.1 10*3/uL (ref 0.0–0.7)
HCT: 41.2 % (ref 39.0–52.0)
Lymphs Abs: 1.1 10*3/uL (ref 0.7–4.0)
Monocytes Absolute: 0.5 10*3/uL (ref 0.1–1.0)
Monocytes Relative: 8.4 % (ref 3.0–12.0)
Platelets: 279 10*3/uL (ref 150.0–400.0)
RDW: 12.3 % (ref 11.5–14.6)

## 2011-09-22 LAB — LIPID PANEL
HDL: 39.1 mg/dL (ref >39.00)
Total CHOL/HDL Ratio: 4
Triglycerides: 155 mg/dL — ABNORMAL HIGH (ref 0.0–149.0)

## 2011-09-22 LAB — URINALYSIS, ROUTINE W REFLEX MICROSCOPIC
Ketones, ur: NEGATIVE
Specific Gravity, Urine: >=1.03 (ref 1.000–1.030)
Urine Glucose: NEGATIVE
pH: 5.5 (ref 5.0–8.0)

## 2011-09-22 NOTE — Assessment & Plan Note (Signed)
Exam done, attempted to update vaccines (he refused a flu vax), labs ordered, asked to get colonoscopy done, pt ed material was given

## 2011-09-22 NOTE — Patient Instructions (Signed)
Health Maintenance, Males A healthy lifestyle and preventative care can promote health and wellness.  Maintain regular health, dental, and eye exams.   Eat a healthy diet. Foods like vegetables, fruits, whole grains, low-fat dairy products, and lean protein foods contain the nutrients you need without too many calories. Decrease your intake of foods high in solid fats, added sugars, and salt. Get information about a proper diet from your caregiver, if necessary.   Regular physical exercise is one of the most important things you can do for your health. Most adults should get at least 150 minutes of moderate-intensity exercise (any activity that increases your heart rate and causes you to sweat) each week. In addition, most adults need muscle-strengthening exercises on 2 or more days a week.    Maintain a healthy weight. The body mass index (BMI) is a screening tool to identify possible weight problems. It provides an estimate of body fat based on height and weight. Your caregiver can help determine your BMI, and can help you achieve or maintain a healthy weight. For adults 20 years and older:   A BMI below 18.5 is considered underweight.   A BMI of 18.5 to 24.9 is normal.   A BMI of 25 to 29.9 is considered overweight.   A BMI of 30 and above is considered obese.   Maintain normal blood lipids and cholesterol by exercising and minimizing your intake of saturated fat. Eat a balanced diet with plenty of fruits and vegetables. Blood tests for lipids and cholesterol should begin at age 20 and be repeated every 5 years. If your lipid or cholesterol levels are high, you are over 50, or you are a high risk for heart disease, you may need your cholesterol levels checked more frequently.Ongoing high lipid and cholesterol levels should be treated with medicines, if diet and exercise are not effective.   If you smoke, find out from your caregiver how to quit. If you do not use tobacco, do not start.    If you choose to drink alcohol, do not exceed 2 drinks per day. One drink is considered to be 12 ounces (355 mL) of beer, 5 ounces (148 mL) of wine, or 1.5 ounces (44 mL) of liquor.   Avoid use of street drugs. Do not share needles with anyone. Ask for help if you need support or instructions about stopping the use of drugs.   High blood pressure causes heart disease and increases the risk of stroke. Blood pressure should be checked at least every 1 to 2 years. Ongoing high blood pressure should be treated with medicines if weight loss and exercise are not effective.   If you are 45 to 51 years old, ask your caregiver if you should take aspirin to prevent heart disease.   Diabetes screening involves taking a blood sample to check your fasting blood sugar level. This should be done once every 3 years, after age 45, if you are within normal weight and without risk factors for diabetes. Testing should be considered at a younger age or be carried out more frequently if you are overweight and have at least 1 risk factor for diabetes.   Colorectal cancer can be detected and often prevented. Most routine colorectal cancer screening begins at the age of 50 and continues through age 75. However, your caregiver may recommend screening at an earlier age if you have risk factors for colon cancer. On a yearly basis, your caregiver may provide home test kits to check for hidden   blood in the stool. Use of a small camera at the end of a tube, to directly examine the colon (sigmoidoscopy or colonoscopy), can detect the earliest forms of colorectal cancer. Talk to your caregiver about this at age 50, when routine screening begins. Direct examination of the colon should be repeated every 5 to 10 years through age 75, unless early forms of pre-cancerous polyps or small growths are found.   Healthy men should no longer receive prostate-specific antigen (PSA) blood tests as part of routine cancer screening. Consult with  your caregiver about prostate cancer screening.   Practice safe sex. Use condoms and avoid high-risk sexual practices to reduce the spread of sexually transmitted infections (STIs).   Use sunscreen with a sun protection factor (SPF) of 30 or greater. Apply sunscreen liberally and repeatedly throughout the day. You should seek shade when your shadow is shorter than you. Protect yourself by wearing long sleeves, pants, a wide-brimmed hat, and sunglasses year round, whenever you are outdoors.   Notify your caregiver of new moles or changes in moles, especially if there is a change in shape or color. Also notify your caregiver if a mole is larger than the size of a pencil eraser.   A one-time screening for abdominal aortic aneurysm (AAA) and surgical repair of large AAAs by sound wave imaging (ultrasonography) is recommended for ages 65 to 75 years who are current or former smokers.   Stay current with your immunizations.  Document Released: 03/30/2008 Document Revised: 06/14/2011 Document Reviewed: 02/27/2011 [employer] Patient Information 2012 [employer]. 

## 2011-09-22 NOTE — Progress Notes (Signed)
  Subjective:    Patient ID: Roger Wagner, male    DOB: [birth year], 50 y.o.   MRN: [id number]  HPI He returns for a physical and feels well with no complaints.   Review of Systems  Constitutional: Negative.   HENT: Negative.   Eyes: Negative.   Respiratory: Negative.   Cardiovascular: Negative.   Gastrointestinal: Negative.   Genitourinary: Negative.   Musculoskeletal: Negative.   Skin: Negative.   Neurological: Negative.   Hematological: Negative.   Psychiatric/Behavioral: Negative.        Objective:   Physical Exam  Vitals reviewed. Constitutional: He is oriented to person, place, and time. He appears well-developed and well-nourished. No distress.  HENT:  Head: Normocephalic and atraumatic.  Mouth/Throat: Oropharynx is clear and moist. No oropharyngeal exudate.  Eyes: Conjunctivae are normal. Right eye exhibits no discharge. Left eye exhibits no discharge. No scleral icterus.  Neck: Normal range of motion. Neck supple. No JVD present. No tracheal deviation present. No thyromegaly present.  Cardiovascular: Normal rate, regular rhythm, normal heart sounds and intact distal pulses.  Exam reveals no gallop and no friction rub.   No murmur heard. Pulmonary/Chest: Effort normal and breath sounds normal. No stridor. No respiratory distress. He has no wheezes. He has no rales. He exhibits no tenderness.  Abdominal: Soft. Bowel sounds are normal. He exhibits no distension. There is no tenderness. There is no rebound and no guarding. Hernia confirmed negative in the right inguinal area and confirmed negative in the left inguinal area.  Genitourinary: Rectum normal, prostate normal, testes normal and penis normal. Rectal exam shows no external hemorrhoid, no internal hemorrhoid, no fissure, no tenderness and anal tone normal. Guaiac negative stool. Prostate is not enlarged and not tender. Right testis shows no mass, no swelling and no tenderness. Right testis is descended. Left testis  shows no mass, no swelling and no tenderness. Left testis is descended. Circumcised. No penile tenderness. No discharge found.  Musculoskeletal: Normal range of motion. He exhibits no edema and no tenderness.  Lymphadenopathy:    He has no cervical adenopathy.       Right: No inguinal adenopathy present.       Left: No inguinal adenopathy present.  Neurological: He is oriented to person, place, and time.  Skin: Skin is warm and dry. No rash noted. He is not diaphoretic. No erythema. No pallor.  Psychiatric: He has a normal mood and affect. His behavior is normal. Judgment and thought content normal.           Assessment & Plan:

## 2011-09-28 ENCOUNTER — Other Ambulatory Visit: Payer: Self-pay | Admitting: Internal Medicine

## 2011-12-04 ENCOUNTER — Encounter: Payer: Self-pay | Admitting: Gastroenterology

## 2012-09-23 ENCOUNTER — Encounter: Payer: PRIVATE HEALTH INSURANCE | Admitting: Internal Medicine

## 2013-03-21 ENCOUNTER — Encounter: Payer: Self-pay | Admitting: Internal Medicine

## 2013-03-21 ENCOUNTER — Other Ambulatory Visit (INDEPENDENT_AMBULATORY_CARE_PROVIDER_SITE_OTHER): Payer: PRIVATE HEALTH INSURANCE

## 2013-03-21 ENCOUNTER — Ambulatory Visit (INDEPENDENT_AMBULATORY_CARE_PROVIDER_SITE_OTHER): Payer: PRIVATE HEALTH INSURANCE | Admitting: Internal Medicine

## 2013-03-21 VITALS — BP 118/78 | HR 64 | Temp 98.7°F | Resp 16 | Ht 72.0 in | Wt 199.5 lb

## 2013-03-21 DIAGNOSIS — M1710 Unilateral primary osteoarthritis, unspecified knee: Secondary | ICD-10-CM

## 2013-03-21 DIAGNOSIS — IMO0002 Reserved for concepts with insufficient information to code with codable children: Secondary | ICD-10-CM

## 2013-03-21 DIAGNOSIS — Z0000 Encounter for general adult medical examination without abnormal findings: Secondary | ICD-10-CM

## 2013-03-21 LAB — URINALYSIS, ROUTINE W REFLEX MICROSCOPIC
Leukocytes, UA: NEGATIVE
Nitrite: NEGATIVE
RBC / HPF: NONE SEEN (ref 0–?)
Specific Gravity, Urine: >=1.03 (ref 1.000–1.030)
Total Protein, Urine: NEGATIVE
WBC, UA: NONE SEEN (ref 0–?)
pH: 6 (ref 5.0–8.0)

## 2013-03-21 LAB — CBC WITH DIFFERENTIAL/PLATELET
Basophils Relative: 0.7 % (ref 0.0–3.0)
Eosinophils Relative: 2.9 % (ref 0.0–5.0)
HCT: 41.9 % (ref 39.0–52.0)
Lymphs Abs: 1.1 10*3/uL (ref 0.7–4.0)
MCV: 87.7 fl (ref 78.0–100.0)
Monocytes Absolute: 0.4 10*3/uL (ref 0.1–1.0)
Monocytes Relative: 7.5 % (ref 3.0–12.0)
Neutrophils Relative %: 69.4 % (ref 43.0–77.0)
RBC: 4.78 Mil/uL (ref 4.22–5.81)
WBC: 5.6 10*3/uL (ref 4.5–10.5)

## 2013-03-21 LAB — COMPREHENSIVE METABOLIC PANEL
Albumin: 4.2 g/dL (ref 3.5–5.2)
Alkaline Phosphatase: 73 U/L (ref 39–117)
BUN: 18 mg/dL (ref 6–23)
CO2: 27 mEq/L (ref 19–32)
GFR: 77.75 mL/min (ref >60.00)
Glucose, Bld: 87 mg/dL (ref 70–99)
Potassium: 4 mEq/L (ref 3.5–5.1)

## 2013-03-21 LAB — TSH: TSH: 2.36 u[IU]/mL (ref 0.35–5.50)

## 2013-03-21 LAB — LIPID PANEL
Cholesterol: 184 mg/dL (ref 0–200)
VLDL: 39.6 mg/dL (ref 0.0–40.0)

## 2013-03-21 LAB — SEDIMENTATION RATE: Sed Rate: 11 mm/hr (ref 0–22)

## 2013-03-21 LAB — HEPATITIS C ANTIBODY: HCV Ab: NEGATIVE

## 2013-03-21 MED ORDER — NAPROXEN 375 MG PO TABS: 375.0000 mg | ORAL_TABLET | Freq: Two times a day (BID) | ORAL | Status: DC

## 2013-03-21 NOTE — Assessment & Plan Note (Signed)
I will check an ESR to see if he has any evidence of an inflammatory arthropathy He will start nsaids for symptom relief

## 2013-03-21 NOTE — Patient Instructions (Addendum)
Degenerative Arthritis You have osteoarthritis. This is the wear and tear arthritis that comes with aging. It is also called degenerative arthritis. This is common in people past middle age. It is caused by stress on the joints. The large weight bearing joints of the lower extremities are most often affected. The knees, hips, back, neck, and hands can become painful, swollen, and stiff. This is the most common type of arthritis. It comes on with age, carrying too much weight, or from an injury. Treatment includes resting the sore joint until the pain and swelling improve. Crutches or a walker may be needed for severe flares. Only take over-the-counter or prescription medicines for pain, discomfort, or fever as directed by your caregiver. Local heat therapy may improve motion. Cortisone shots into the joint are sometimes used to reduce pain and swelling during flares. Osteoarthritis is usually not crippling and progresses slowly. There are things you can do to decrease pain:  Avoid high impact activities.  Exercise regularly.  Low impact exercises such as walking, biking and swimming help to keep the muscles strong and keep normal joint function.  Stretching helps to keep your range of motion.  Lose weight if you are overweight. This reduces joint stress. In severe cases when you have pain at rest or increasing disability, joint surgery may be helpful. See your caregiver for follow-up treatment as recommended.  SEEK IMMEDIATE MEDICAL CARE IF:   You have severe joint pain.  Marked swelling and redness in your joint develops.  You develop a high fever. Document Released: 10/02/2005 Document Revised: 12/25/2011 Document Reviewed: 03/04/2007 [facility] Patient Information 2014 [place], [place]. Health Maintenance, Males A healthy lifestyle and preventative care can promote health and wellness.  Maintain regular health, dental, and eye exams.  Eat a healthy diet. Foods like vegetables, fruits,  whole grains, low-fat dairy products, and lean protein foods contain the nutrients you need without too many calories. Decrease your intake of foods high in solid fats, added sugars, and salt. Get information about a proper diet from your caregiver, if necessary.  Regular physical exercise is one of the most important things you can do for your health. Most adults should get at least 150 minutes of moderate-intensity exercise (any activity that increases your heart rate and causes you to sweat) each week. In addition, most adults need muscle-strengthening exercises on 2 or more days a week.   Maintain a healthy weight. The body mass index (BMI) is a screening tool to identify possible weight problems. It provides an estimate of body fat based on height and weight. Your caregiver can help determine your BMI, and can help you achieve or maintain a healthy weight. For adults 20 years and older:  A BMI below 18.5 is considered underweight.  A BMI of 18.5 to 24.9 is normal.  A BMI of 25 to 29.9 is considered overweight.  A BMI of 30 and above is considered obese.  Maintain normal blood lipids and cholesterol by exercising and minimizing your intake of saturated fat. Eat a balanced diet with plenty of fruits and vegetables. Blood tests for lipids and cholesterol should begin at age 54 and be repeated every 5 years. If your lipid or cholesterol levels are high, you are over 50, or you are a high risk for heart disease, you may need your cholesterol levels checked more frequently.Ongoing high lipid and cholesterol levels should be treated with medicines, if diet and exercise are not effective.  If you smoke, find out from your caregiver how  to quit. If you do not use tobacco, do not start.  If you choose to drink alcohol, do not exceed 2 drinks per day. One drink is considered to be 12 ounces (355 mL) of beer, 5 ounces (148 mL) of wine, or 1.5 ounces (44 mL) of liquor.  Avoid use of street drugs. Do  not share needles with anyone. Ask for help if you need support or instructions about stopping the use of drugs.  High blood pressure causes heart disease and increases the risk of stroke. Blood pressure should be checked at least every 1 to 2 years. Ongoing high blood pressure should be treated with medicines if weight loss and exercise are not effective.  If you are 74 to 53 years old, ask your caregiver if you should take aspirin to prevent heart disease.  Diabetes screening involves taking a blood sample to check your fasting blood sugar level. This should be done once every 3 years, after age 45, if you are within normal weight and without risk factors for diabetes. Testing should be considered at a younger age or be carried out more frequently if you are overweight and have at least 1 risk factor for diabetes.  Colorectal cancer can be detected and often prevented. Most routine colorectal cancer screening begins at the age of 8 and continues through age 71. However, your caregiver may recommend screening at an earlier age if you have risk factors for colon cancer. On a yearly basis, your caregiver may provide home test kits to check for hidden blood in the stool. Use of a small camera at the end of a tube, to directly examine the colon (sigmoidoscopy or colonoscopy), can detect the earliest forms of colorectal cancer. Talk to your caregiver about this at age 38, when routine screening begins. Direct examination of the colon should be repeated every 5 to 10 years through age 13, unless early forms of pre-cancerous polyps or small growths are found.  Hepatitis C blood testing is recommended for all people born from [birth year] through 1966 and any individual with known risks for hepatitis C.  Healthy men should no longer receive prostate-specific antigen (PSA) blood tests as part of routine cancer screening. Consult with your caregiver about prostate cancer screening.  Testicular cancer screening is not  recommended for adolescents or adult males who have no symptoms. Screening includes self-exam, caregiver exam, and other screening tests. Consult with your caregiver about any symptoms you have or any concerns you have about testicular cancer.  Practice safe sex. Use condoms and avoid high-risk sexual practices to reduce the spread of sexually transmitted infections (STIs).  Use sunscreen with a sun protection factor (SPF) of 30 or greater. Apply sunscreen liberally and repeatedly throughout the day. You should seek shade when your shadow is shorter than you. Protect yourself by wearing long sleeves, pants, a wide-brimmed hat, and sunglasses year round, whenever you are outdoors.  Notify your caregiver of new moles or changes in moles, especially if there is a change in shape or color. Also notify your caregiver if a mole is larger than the size of a pencil eraser.  A one-time screening for abdominal aortic aneurysm (AAA) and surgical repair of large AAAs by sound wave imaging (ultrasonography) is recommended for ages 24 to 58 years who are current or former smokers.  Stay current with your immunizations. Document Released: 03/30/2008 Document Revised: 12/25/2011 Document Reviewed: 02/27/2011 [facility] Patient Information 2014 [place], [place].

## 2013-03-21 NOTE — Progress Notes (Signed)
Subjective:    Patient ID: Roger Wagner, male    DOB: Dec 23, 1960, 52 y.o.   MRN: [id number]  Arthritis Presents for initial visit. The disease course has been fluctuating. The condition has lasted for 2 years. He complains of pain. He reports no stiffness, joint swelling or joint warmth. Affected locations include the left elbow, right shoulder, left shoulder, right elbow, left knee, right knee, left wrist and right wrist. His pain is at a severity of 2/10. Associated symptoms include pain at night. Pertinent negatives include no diarrhea, dry eyes, dry mouth, dysuria, fatigue, fever, pain while resting, rash, Raynaud's syndrome, uveitis or weight loss. There is no history of chronic back pain, lupus, osteoarthritis, psoriasis or rheumatoid arthritis.  His pertinent risk factors include overuse. Past treatments include acetaminophen. The treatment provided mild relief. Factors aggravating his arthritis include climbing stairs. Compliance with prior treatments has been good.      Review of Systems  Constitutional: Negative.  Negative for fever, chills, weight loss, diaphoresis, activity change, appetite change, fatigue and unexpected weight change.  HENT: Negative.   Eyes: Negative.   Respiratory: Negative.   Cardiovascular: Negative.   Gastrointestinal: Negative.  Negative for diarrhea.  Endocrine: Negative.   Genitourinary: Negative.  Negative for dysuria.  Musculoskeletal: Positive for arthralgias and arthritis. Negative for myalgias, back pain, joint swelling, gait problem and stiffness.  Skin: Negative.  Negative for rash.  Allergic/Immunologic: Negative.   Neurological: Negative.   Hematological: Negative.  Negative for adenopathy. Does not bruise/bleed easily.  Psychiatric/Behavioral: Negative.        Objective:   Physical Exam  Vitals reviewed. Constitutional: He is oriented to person, place, and time. He appears well-developed and well-nourished. No distress.  HENT:  Head:  Normocephalic and atraumatic.  Mouth/Throat: Oropharynx is clear and moist. No oropharyngeal exudate.  Eyes: Conjunctivae are normal. Right eye exhibits no discharge. Left eye exhibits no discharge. No scleral icterus.  Neck: Normal range of motion. Neck supple. No JVD present. No tracheal deviation present. No thyromegaly present.  Cardiovascular: Normal rate, regular rhythm, normal heart sounds and intact distal pulses.  Exam reveals no gallop and no friction rub.   No murmur heard. Pulmonary/Chest: Effort normal and breath sounds normal. No stridor. No respiratory distress. He has no wheezes. He has no rales. He exhibits no tenderness.  Abdominal: Soft. Bowel sounds are normal. He exhibits no distension and no mass. There is no tenderness. There is no rebound and no guarding. Hernia confirmed negative in the right inguinal area and confirmed negative in the left inguinal area.  Genitourinary: Rectum normal, prostate normal, testes normal and penis normal. Rectal exam shows no external hemorrhoid, no internal hemorrhoid, no fissure, no mass, no tenderness and anal tone normal. Guaiac negative stool. Prostate is not enlarged and not tender. Right testis shows no mass, no swelling and no tenderness. Right testis is descended. Left testis shows no mass, no swelling and no tenderness. Left testis is descended. Circumcised. No penile erythema or penile tenderness. No discharge found.  Musculoskeletal: Normal range of motion. He exhibits no edema and no tenderness.  Lymphadenopathy:    He has no cervical adenopathy.       Right: No inguinal adenopathy present.       Left: No inguinal adenopathy present.  Neurological: He is oriented to person, place, and time.  Skin: Skin is warm and dry. No rash noted. He is not diaphoretic. No erythema. No pallor.  Psychiatric: He has a normal mood and affect.  His behavior is normal. Judgment and thought content normal.     Lab Results  Component Value Date   WBC  5.5 09/22/2011   HGB 14.3 09/22/2011   HCT 41.2 09/22/2011   PLT 279.0 09/22/2011   GLUCOSE 103* 09/22/2011   CHOL 170 09/22/2011   TRIG 155.0* 09/22/2011   HDL 39.10 09/22/2011   LDLCALC 100* 09/22/2011   ALT 29 09/22/2011   AST 30 09/22/2011   NA 140 09/22/2011   K 4.0 09/22/2011   CL 106 09/22/2011   CREATININE 1.1 09/22/2011   BUN 24* 09/22/2011   CO2 27 09/22/2011   TSH 1.91 09/22/2011   PSA 1.04 09/22/2011       Assessment & Plan:

## 2013-03-21 NOTE — Assessment & Plan Note (Addendum)
Exam done Vaccines were reviewed Labs ordered He hs been referred for a colonoscopy Pt ed material was given

## 2013-03-24 ENCOUNTER — Encounter: Payer: Self-pay | Admitting: Internal Medicine

## 2013-03-25 ENCOUNTER — Other Ambulatory Visit: Payer: Self-pay | Admitting: Internal Medicine

## 2013-06-06 ENCOUNTER — Encounter: Payer: Self-pay | Admitting: Internal Medicine

## 2013-10-16 DIAGNOSIS — Z87442 Personal history of urinary calculi: Secondary | ICD-10-CM

## 2013-10-16 HISTORY — DX: Personal history of urinary calculi: Z87.442

## 2013-10-16 HISTORY — PX: COLONOSCOPY: SHX174

## 2013-12-15 ENCOUNTER — Encounter: Payer: Self-pay | Admitting: Internal Medicine

## 2013-12-15 ENCOUNTER — Ambulatory Visit (INDEPENDENT_AMBULATORY_CARE_PROVIDER_SITE_OTHER): Payer: PRIVATE HEALTH INSURANCE | Admitting: Internal Medicine

## 2013-12-15 VITALS — BP 108/70 | HR 76 | Temp 98.4°F | Wt 201.1 lb

## 2013-12-15 DIAGNOSIS — J209 Acute bronchitis, unspecified: Secondary | ICD-10-CM

## 2013-12-15 MED ORDER — AZITHROMYCIN 250 MG PO TABS: ORAL_TABLET | ORAL | Status: DC

## 2013-12-15 MED ORDER — HYDROCODONE-HOMATROPINE 5-1.5 MG/5ML PO SYRP: 5.0000 mL | ORAL_SOLUTION | Freq: Four times a day (QID) | ORAL | Status: DC | PRN

## 2013-12-15 NOTE — Patient Instructions (Signed)

## 2013-12-15 NOTE — Progress Notes (Signed)
Pre visit review using our clinic review tool, if applicable. No additional management support is needed unless otherwise documented below in the visit note. 

## 2013-12-15 NOTE — Progress Notes (Signed)
   Subjective:    Patient ID: Roger Wagner, male    DOB: Jun 25, 1961, 53 y.o.   MRN: [id number]  HPI  Symptoms began 12/11/13 as head congestion and malaise. As of 2/27 he had chest congestion as well as fever appeared 2/28 and he was bedridden with chills and sweats .  He has had some scratchy throat and some dental discomfort 2/28.  The cough is described as varying from dry to productive of green sputum. He noted some wheezing  He continues to have pressure in the frontal and maxillary sinuses without significant pain.  He also describes anorexia with the present illness  He took children's liquid Advil and Tylenol for mild arthralgias.  He is a nonsmoker. He has no history of asthma.Flu shot not taken       Review of Systems  He denies itchy, watery eyes, sneezing. He has had no nasal purulence.  There has been no associated shortness of breath associated with cough.  He denies significant arthralgias or myalgias or sudden onset of symptoms       Objective:   Physical Exam  General appearance:good health ;well nourished; no acute distress or increased work of breathing is present.  No  lymphadenopathy about the head, neck, or axilla noted.   Eyes: No conjunctival inflammation or lid edema is present. There is no scleral icterus.  Ears:  External ear exam shows no significant lesions or deformities.  Otoscopic examination reveals clear canals, tympanic membranes are intact bilaterally without bulging, retraction, inflammation or discharge.  Nose:  External nasal examination shows no deformity or inflammation. Nasal mucosa are boggy  and moist without lesions or exudates. No septal dislocation or deviation.No obstruction to airflow.   Oral exam: Dental hygiene is good; lips and gums are healthy appearing.There is no oropharyngeal erythema or exudate noted.   Neck:  No deformities, thyromegaly, masses, or tenderness noted.   Heart:  Normal rate and regular rhythm. S1 and S2  normal without gallop, murmur, click, rub or other extra sounds.   Lungs:Chest clear to auscultation; no wheezes, rhonchi,rales ,or rubs present.No increased work of breathing.    Extremities:  No cyanosis, edema, or clubbing  noted    Skin: Warm & dry w/o jaundice or tenting.        Assessment & Plan:  #1 acute bronchitis w/o bronchospasm #2 URI, acute Plan: See orders and recommendations

## 2013-12-24 ENCOUNTER — Telehealth: Payer: Self-pay | Admitting: Internal Medicine

## 2013-12-24 MED ORDER — CEFUROXIME AXETIL 500 MG PO TABS: 500.0000 mg | ORAL_TABLET | Freq: Two times a day (BID) | ORAL | Status: DC

## 2013-12-24 NOTE — Telephone Encounter (Signed)
Cefuroxime 500 mg bid #14 OV if no better

## 2013-12-24 NOTE — Telephone Encounter (Signed)
Patient states that he has finished his medications that he was prescribed at last OV on 12/15/13. He is still coughing up green phlem and wants to know what he should do or if another round of antibiotic and cough medicine could be prescribed. Please advise.

## 2013-12-24 NOTE — Telephone Encounter (Signed)
LMOM (1:31pm) informing the pt that Dr. Park Meo an ATB and if not any better he will need a follow-up OV.  Rx sent to the pharmacy by e-script.//AB/CMA

## 2014-03-24 ENCOUNTER — Other Ambulatory Visit: Payer: Self-pay | Admitting: Internal Medicine

## 2014-03-26 ENCOUNTER — Other Ambulatory Visit: Payer: Self-pay | Admitting: Internal Medicine

## 2014-07-24 ENCOUNTER — Encounter (HOSPITAL_COMMUNITY): Payer: Self-pay | Admitting: Emergency Medicine

## 2014-07-24 ENCOUNTER — Emergency Department (HOSPITAL_COMMUNITY)
Admission: EM | Admit: 2014-07-24 | Discharge: 2014-07-24 | Disposition: A | Discharge status: Home / Self Care | Payer: No Typology Code available for payment source | Attending: Emergency Medicine | Admitting: Emergency Medicine

## 2014-07-24 ENCOUNTER — Emergency Department (HOSPITAL_COMMUNITY): Payer: No Typology Code available for payment source

## 2014-07-24 DIAGNOSIS — N200 Calculus of kidney: Secondary | ICD-10-CM

## 2014-07-24 DIAGNOSIS — N1330 Unspecified hydronephrosis: Secondary | ICD-10-CM | POA: Insufficient documentation

## 2014-07-24 DIAGNOSIS — Z8659 Personal history of other mental and behavioral disorders: Secondary | ICD-10-CM | POA: Diagnosis not present

## 2014-07-24 DIAGNOSIS — R109 Unspecified abdominal pain: Secondary | ICD-10-CM | POA: Diagnosis present

## 2014-07-24 LAB — URINALYSIS, ROUTINE W REFLEX MICROSCOPIC
BILIRUBIN URINE: NEGATIVE
Glucose, UA: NEGATIVE mg/dL
Ketones, ur: NEGATIVE mg/dL
Leukocytes, UA: NEGATIVE
Nitrite: NEGATIVE
PROTEIN: NEGATIVE mg/dL
SPECIFIC GRAVITY, URINE: 1.016 (ref 1.005–1.030)
UROBILINOGEN UA: 0.2 mg/dL (ref 0.0–1.0)
pH: 7 (ref 5.0–8.0)

## 2014-07-24 LAB — URINE MICROSCOPIC-ADD ON

## 2014-07-24 LAB — BASIC METABOLIC PANEL
Anion gap: 17 — ABNORMAL HIGH (ref 5–15)
BUN: 23 mg/dL (ref 6–23)
CALCIUM: 9.9 mg/dL (ref 8.4–10.5)
CO2: 24 mEq/L (ref 19–32)
Chloride: 98 mEq/L (ref 96–112)
Creatinine, Ser: 1.43 mg/dL — ABNORMAL HIGH (ref 0.50–1.35)
GFR calc Af Amer: 63 mL/min — ABNORMAL LOW (ref >90)
GFR, EST NON AFRICAN AMERICAN: 54 mL/min — AB (ref >90)
Glucose, Bld: 118 mg/dL — ABNORMAL HIGH (ref 70–99)
Potassium: 3.9 mEq/L (ref 3.7–5.3)
SODIUM: 139 meq/L (ref 137–147)

## 2014-07-24 MED ORDER — HYDROMORPHONE HCL 1 MG/ML IJ SOLN
1.0000 mg | INTRAMUSCULAR | Status: DC | PRN
  Administered 2014-07-24: 1 mg via INTRAVENOUS
  Filled 2014-07-24: qty 1

## 2014-07-24 MED ORDER — TAMSULOSIN HCL 0.4 MG PO CAPS
0.4000 mg | ORAL_CAPSULE | Freq: Once | ORAL | Status: AC
  Administered 2014-07-24: 0.4 mg via ORAL
  Filled 2014-07-24: qty 1

## 2014-07-24 MED ORDER — ONDANSETRON 4 MG PO TBDP: 4.0000 mg | ORAL_TABLET | Freq: Three times a day (TID) | ORAL | Status: DC | PRN

## 2014-07-24 MED ORDER — SODIUM CHLORIDE 0.9 % IV BOLUS (SEPSIS)
1000.0000 mL | Freq: Once | INTRAVENOUS | Status: AC
  Administered 2014-07-24: 1000 mL via INTRAVENOUS

## 2014-07-24 MED ORDER — OXYCODONE-ACETAMINOPHEN 5-325 MG PO TABS: 2.0000 | ORAL_TABLET | ORAL | Status: DC | PRN

## 2014-07-24 MED ORDER — ONDANSETRON HCL 4 MG/2ML IJ SOLN
4.0000 mg | Freq: Once | INTRAMUSCULAR | Status: AC
  Administered 2014-07-24: 4 mg via INTRAVENOUS
  Filled 2014-07-24: qty 2

## 2014-07-24 MED ORDER — KETOROLAC TROMETHAMINE 30 MG/ML IJ SOLN
30.0000 mg | Freq: Once | INTRAMUSCULAR | Status: AC
  Administered 2014-07-24: 30 mg via INTRAVENOUS
  Filled 2014-07-24: qty 1

## 2014-07-24 NOTE — Discharge Instructions (Signed)

## 2014-07-24 NOTE — ED Provider Notes (Signed)
CSN: [id number]     Arrival date & time 07/24/14  4193 History   First MD Initiated Contact with Patient 07/24/14 [phone]     Chief Complaint  Patient presents with  . Flank Pain  . Urinary Frequency  . Nausea     HPI  She presents with right flank pain. Awakened and the father this morning sudden and severe in his right flank. No intra-abdominal radiation. Has similar episode 36 hours ago that resolved after 2-3 hours. Mild associated nausea. No vomiting. His colicky intermittent severe. No history of similar episodes. No history of kidney stones. He is otherwise healthy.  Past Medical History  Diagnosis Date  . Depression    Past Surgical History  Procedure Laterality Date  . Wrist Left    Family History  Problem Relation Age of Onset  . Cancer Other     Colon Cancer  . Diabetes Other   . Stroke Other    History  Substance Use Topics  . Smoking status: Never Smoker   . Smokeless tobacco: Not on file  . Alcohol Use: 0.0 oz/week    0 Cans of beer per week     Comment: social    Review of Systems  Constitutional: Negative for fever, chills, diaphoresis, appetite change and fatigue.  HENT: Negative for mouth sores, sore throat and trouble swallowing.   Eyes: Negative for visual disturbance.  Respiratory: Negative for cough, chest tightness, shortness of breath and wheezing.   Cardiovascular: Negative for chest pain.  Gastrointestinal: Positive for nausea. Negative for vomiting, abdominal pain, diarrhea and abdominal distention.  Endocrine: Negative for polydipsia, polyphagia and polyuria.  Genitourinary: Positive for flank pain. Negative for dysuria, frequency and hematuria.  Musculoskeletal: Negative for gait problem.  Skin: Negative for color change, pallor and rash.  Neurological: Negative for dizziness, syncope, light-headedness and headaches.  Hematological: Does not bruise/bleed easily.  Psychiatric/Behavioral: Negative for behavioral problems and confusion.       Allergies  Sulfa antibiotics  Home Medications   Prior to Admission medications   Medication Sig Start Date End Date Taking? Authorizing Provider  acetaminophen (TYLENOL) 500 MG tablet Take 1,000 mg by mouth every 6 (six) hours as needed for mild pain.   Yes Historical Provider, MD  Multiple Vitamin (MULTIVITAMIN WITH MINERALS) TABS tablet Take 1 tablet by mouth daily.   Yes Historical Provider, MD  ondansetron (ZOFRAN ODT) 4 MG disintegrating tablet Take 1 tablet (4 mg total) by mouth every 8 (eight) hours as needed for nausea. 07/24/14   Tanna Furry, MD  oxyCODONE-acetaminophen (PERCOCET/ROXICET) 5-325 MG per tablet Take 2 tablets by mouth every 4 (four) hours as needed. 07/24/14   Tanna Furry, MD   BP 128/72  Pulse 56  Temp(Src) 97.6 F (36.4 C) (Oral)  Resp 18  SpO2 100% Physical Exam  Constitutional: He is oriented to person, place, and time. He appears well-developed and well-nourished. No distress.  HENT:  Head: Normocephalic.  Eyes: Conjunctivae are normal. Pupils are equal, round, and reactive to light. No scleral icterus.  Neck: Normal range of motion. Neck supple. No thyromegaly present.  Cardiovascular: Normal rate and regular rhythm.  Exam reveals no gallop and no friction rub.   No murmur heard. Pulmonary/Chest: Effort normal and breath sounds normal. No respiratory distress. He has no wheezes. He has no rales.  Abdominal: Soft. Bowel sounds are normal. He exhibits no distension. There is no tenderness. There is no rebound.    Musculoskeletal: Normal range of motion.  Back:  Neurological: He is alert and oriented to person, place, and time.  Skin: Skin is warm and dry. No rash noted.  Psychiatric: He has a normal mood and affect. His behavior is normal.    ED Course  Procedures (including critical care time) Labs Review Labs Reviewed  URINALYSIS, ROUTINE W REFLEX MICROSCOPIC - Abnormal; Notable for the following:    Hgb urine dipstick LARGE (*)     All other components within normal limits  BASIC METABOLIC PANEL - Abnormal; Notable for the following:    Glucose, Bld 118 (*)    Creatinine, Ser 1.43 (*)    GFR calc non Af Amer 54 (*)    GFR calc Af Amer 63 (*)    Anion gap 17 (*)    All other components within normal limits  URINE MICROSCOPIC-ADD ON    Imaging Review Ct Abdomen Pelvis Wo Contrast  07/24/2014   CLINICAL DATA:  RIGHT flank pain yesterday subsiding through night then starting at 0500 hr again, urinary frequency, nausea, vomiting, hematuria  EXAM: CT ABDOMEN AND PELVIS WITHOUT CONTRAST  TECHNIQUE: Multidetector CT imaging of the abdomen and pelvis was performed following the standard protocol without IV contrast. Sagittal and coronal MPR images reconstructed from axial data set. Oral contrast not administered for this indication.  COMPARISON:  None  FINDINGS: Lung bases clear.  RIGHT hydronephrosis and hydroureter.  Perinephric and proximal periureteral edema.  1-2 mm diameter calculus at RIGHT ureterovesical junction.  No additional urinary tract calcification or LEFT-sided urinary tract dilatation.  Minimal prostatic enlargement.  Slightly higher attenuation of urinary bladder question due to hematuria.  Within limits of a nonenhanced exam, liver, spleen, pancreas, kidneys, and adrenal glands normal appearance.  Stomach and bowel loops unremarkable for technique.  Normal appendix.  Small umbilical hernia containing fat.  No mass, adenopathy, free fluid or inflammatory process.  Mild scattered degenerative disc disease changes lumbar spine.  IMPRESSION: Mild RIGHT hydronephrosis and hydroureter secondary to a 1-2 mm diameter calculus at the RIGHT ureterovesical junction.   Electronically Signed   By: Lavonia Dana M.D.   On: 07/24/2014 09:21     EKG Interpretation None      MDM   Final diagnoses:  Kidney stone    CT scan shows distal small stone with hydronephrosis. Symptoms well controlled. Minimal elevation of creatinine.  Bloody urine. He is asymptomatic. Mandatory passed a stone. Plan discharge home expectant management.    Tanna Furry, MD 07/24/14 [phone]

## 2014-07-24 NOTE — ED Notes (Signed)
Pt reports right flank pain yesterday, subsiding throughout the night, and starting this am 0500 again. Pt reports frequency and n/v but denies other symptoms.

## 2014-07-24 NOTE — ED Notes (Signed)
Pt reports R flank pain that started yesterday and then stopped, resumed this am around 5. Pt has sever R flank pain with urinary frequency. Pt denies dysuria or hematuria. Pt reports several episodes of emesis yesterday but only nausea this am. Pt is kneeling on floor in triage d/t severe pain but is cooperative. Pt is A&O and in NAD

## 2014-07-24 NOTE — ED Notes (Signed)
Pt unable to void at present time. Pt encouraged to void as soon a possible for urine sample.

## 2014-07-24 NOTE — ED Notes (Signed)
Pt attempting to void at present time.

## 2014-07-24 NOTE — ED Notes (Signed)
Pt reports right flank pain 6/10 at present time. Pt reports will alert staff if would like something for pain. Pt unable to provide urine sample at present time.

## 2014-08-13 ENCOUNTER — Other Ambulatory Visit (INDEPENDENT_AMBULATORY_CARE_PROVIDER_SITE_OTHER): Payer: PRIVATE HEALTH INSURANCE

## 2014-08-13 ENCOUNTER — Ambulatory Visit (INDEPENDENT_AMBULATORY_CARE_PROVIDER_SITE_OTHER): Payer: PRIVATE HEALTH INSURANCE | Admitting: Internal Medicine

## 2014-08-13 ENCOUNTER — Encounter: Payer: Self-pay | Admitting: Internal Medicine

## 2014-08-13 VITALS — BP 120/68 | HR 58 | Temp 97.8°F | Resp 16 | Ht 72.0 in | Wt 195.0 lb

## 2014-08-13 DIAGNOSIS — Z0000 Encounter for general adult medical examination without abnormal findings: Secondary | ICD-10-CM

## 2014-08-13 DIAGNOSIS — Z125 Encounter for screening for malignant neoplasm of prostate: Secondary | ICD-10-CM

## 2014-08-13 LAB — COMPREHENSIVE METABOLIC PANEL
ALBUMIN: 3.9 g/dL (ref 3.5–5.2)
ALT: 20 U/L (ref 0–53)
AST: 25 U/L (ref 0–37)
Alkaline Phosphatase: 75 U/L (ref 39–117)
BUN: 15 mg/dL (ref 6–23)
CHLORIDE: 105 meq/L (ref 96–112)
CO2: 26 meq/L (ref 19–32)
CREATININE: 1.1 mg/dL (ref 0.4–1.5)
Calcium: 9.2 mg/dL (ref 8.4–10.5)
GFR: 78.19 mL/min (ref >60.00)
GLUCOSE: 93 mg/dL (ref 70–99)
POTASSIUM: 3.5 meq/L (ref 3.5–5.1)
Sodium: 136 mEq/L (ref 135–145)
Total Bilirubin: 0.8 mg/dL (ref 0.2–1.2)
Total Protein: 7.5 g/dL (ref 6.0–8.3)

## 2014-08-13 LAB — CBC WITH DIFFERENTIAL/PLATELET
BASOS PCT: 0.6 % (ref 0.0–3.0)
Basophils Absolute: 0 10*3/uL (ref 0.0–0.1)
EOS ABS: 0 10*3/uL (ref 0.0–0.7)
EOS PCT: 0.6 % (ref 0.0–5.0)
HCT: 41.6 % (ref 39.0–52.0)
HEMOGLOBIN: 13.9 g/dL (ref 13.0–17.0)
Lymphocytes Relative: 23.7 % (ref 12.0–46.0)
Lymphs Abs: 1.3 10*3/uL (ref 0.7–4.0)
MCHC: 33.3 g/dL (ref 30.0–36.0)
MCV: 87.9 fl (ref 78.0–100.0)
MONOS PCT: 6.9 % (ref 3.0–12.0)
Monocytes Absolute: 0.4 10*3/uL (ref 0.1–1.0)
NEUTROS ABS: 3.7 10*3/uL (ref 1.4–7.7)
Neutrophils Relative %: 68.2 % (ref 43.0–77.0)
Platelets: 312 10*3/uL (ref 150.0–400.0)
RBC: 4.73 Mil/uL (ref 4.22–5.81)
RDW: 12.6 % (ref 11.5–15.5)
WBC: 5.4 10*3/uL (ref 4.0–10.5)

## 2014-08-13 LAB — LIPID PANEL
CHOLESTEROL: 160 mg/dL (ref 0–200)
HDL: 46.1 mg/dL (ref >39.00)
LDL CALC: 91 mg/dL (ref 0–99)
NonHDL: 113.9
Total CHOL/HDL Ratio: 3
Triglycerides: 117 mg/dL (ref 0.0–149.0)
VLDL: 23.4 mg/dL (ref 0.0–40.0)

## 2014-08-13 LAB — FECAL OCCULT BLOOD, GUAIAC: FECAL OCCULT BLD: NEGATIVE

## 2014-08-13 MED ORDER — CLONAZEPAM 1 MG PO TABS: ORAL_TABLET | ORAL | Status: DC

## 2014-08-13 NOTE — Patient Instructions (Signed)

## 2014-08-14 ENCOUNTER — Encounter: Payer: Self-pay | Admitting: Internal Medicine

## 2014-08-14 LAB — PSA: PSA: 1.6 ng/mL (ref 0.10–4.00)

## 2014-08-14 LAB — TSH: TSH: 1.33 u[IU]/mL (ref 0.35–4.50)

## 2014-08-16 ENCOUNTER — Encounter: Payer: Self-pay | Admitting: Internal Medicine

## 2014-08-16 NOTE — Assessment & Plan Note (Signed)
Exam done He refused a flu vax Labs ordered He was referred for a colonoscopy Pt ed material was given

## 2014-08-16 NOTE — Progress Notes (Signed)
   Subjective:    Patient ID: Roger Wagner, male    DOB: Mar 13, 1961, 53 y.o.   MRN: [id number]  HPI Comments: He returns for a physical and has no new or different s/s. He has an occasional episode of anxiety and takes xanax prn.     Review of Systems  Constitutional: Negative.  Negative for fever, chills, diaphoresis, appetite change and fatigue.  HENT: Negative.   Eyes: Negative.   Respiratory: Negative.  Negative for cough, choking, chest tightness, shortness of breath and stridor.   Cardiovascular: Negative.  Negative for chest pain, palpitations and leg swelling.  Gastrointestinal: Negative.  Negative for abdominal pain.  Endocrine: Negative.   Genitourinary: Negative.   Musculoskeletal: Negative.   Skin: Negative.   Allergic/Immunologic: Negative.   Neurological: Negative.   Hematological: Negative.  Negative for adenopathy. Does not bruise/bleed easily.  Psychiatric/Behavioral: Negative for suicidal ideas, hallucinations, behavioral problems, confusion, sleep disturbance, self-injury, dysphoric mood, decreased concentration and agitation. The patient is nervous/anxious. The patient is not hyperactive.        Objective:   Physical Exam  Constitutional: He is oriented to person, place, and time. He appears well-developed and well-nourished. No distress.  HENT:  Head: Normocephalic and atraumatic.  Mouth/Throat: Oropharynx is clear and moist. No oropharyngeal exudate.  Eyes: Conjunctivae are normal. Right eye exhibits no discharge. Left eye exhibits no discharge. No scleral icterus.  Neck: Normal range of motion. Neck supple. No JVD present. No tracheal deviation present. No thyromegaly present.  Cardiovascular: Normal rate, regular rhythm, normal heart sounds and intact distal pulses.  Exam reveals no gallop and no friction rub.   No murmur heard. Pulmonary/Chest: Effort normal and breath sounds normal. No stridor. No respiratory distress. He has no wheezes. He has no rales.  He exhibits no tenderness.  Abdominal: Soft. Bowel sounds are normal. He exhibits no distension and no mass. There is no tenderness. There is no rebound and no guarding. Hernia confirmed negative in the right inguinal area and confirmed negative in the left inguinal area.  Genitourinary: Rectum normal, prostate normal, testes normal and penis normal. Rectal exam shows no external hemorrhoid, no internal hemorrhoid, no fissure, no mass, no tenderness and anal tone normal. Guaiac negative stool. Prostate is not enlarged and not tender. Right testis shows no mass, no swelling and no tenderness. Right testis is descended. Left testis shows no mass, no swelling and no tenderness. Left testis is descended. Circumcised. No penile erythema or penile tenderness. No discharge found.  Musculoskeletal: Normal range of motion. He exhibits no edema or tenderness.  Lymphadenopathy:    He has no cervical adenopathy.       Right: No inguinal adenopathy present.       Left: No inguinal adenopathy present.  Neurological: He is oriented to person, place, and time.  Skin: Skin is warm and dry. No rash noted. He is not diaphoretic. No erythema. No pallor.  Psychiatric: He has a normal mood and affect. His behavior is normal. Judgment and thought content normal.  Vitals reviewed.         Assessment & Plan:

## 2014-08-20 ENCOUNTER — Encounter: Payer: Self-pay | Admitting: Internal Medicine

## 2014-08-21 ENCOUNTER — Telehealth: Payer: Self-pay | Admitting: Internal Medicine

## 2014-08-21 DIAGNOSIS — L700 Acne vulgaris: Secondary | ICD-10-CM

## 2014-08-21 NOTE — Telephone Encounter (Signed)
MD out of office will hold msg to he return for his response...Roger Wagner

## 2014-08-21 NOTE — Telephone Encounter (Signed)
Patient states he mentioned some acne on his face to Dr. Ronnald Ramp at his last visit, but forgot to ask for medication. He would prefer not to go to a dermatologist. He is requesting that Dr. Ronnald Ramp treat this. Pt uses [facility] on [address] CB# [phone]

## 2014-08-23 DIAGNOSIS — L700 Acne vulgaris: Secondary | ICD-10-CM | POA: Insufficient documentation

## 2014-08-23 MED ORDER — CLINDAMYCIN PHOSPHATE 1 % EX GEL: Freq: Two times a day (BID) | CUTANEOUS | Status: DC

## 2014-08-23 NOTE — Telephone Encounter (Signed)
Try clindagel

## 2014-08-24 NOTE — Telephone Encounter (Signed)
Notified pt with md response.../lmb 

## 2014-09-22 ENCOUNTER — Ambulatory Visit (AMBULATORY_SURGERY_CENTER): Payer: Self-pay

## 2014-09-22 VITALS — Ht 72.0 in | Wt 197.2 lb

## 2014-09-22 DIAGNOSIS — Z1211 Encounter for screening for malignant neoplasm of colon: Secondary | ICD-10-CM

## 2014-09-22 MED ORDER — MOVIPREP 100 G PO SOLR: ORAL | Status: DC

## 2014-09-22 NOTE — Progress Notes (Signed)
Per pt, no allergies to soy or egg products.Pt not taking any weight loss meds or using  O2 at home. 

## 2014-10-07 ENCOUNTER — Ambulatory Visit (AMBULATORY_SURGERY_CENTER): Payer: PRIVATE HEALTH INSURANCE | Admitting: Internal Medicine

## 2014-10-07 ENCOUNTER — Encounter: Payer: Self-pay | Admitting: Internal Medicine

## 2014-10-07 VITALS — BP 118/77 | HR 57 | Temp 97.0°F | Resp 15 | Ht 72.0 in | Wt 197.0 lb

## 2014-10-07 DIAGNOSIS — D125 Benign neoplasm of sigmoid colon: Secondary | ICD-10-CM | POA: Diagnosis not present

## 2014-10-07 DIAGNOSIS — D374 Neoplasm of uncertain behavior of colon: Secondary | ICD-10-CM | POA: Diagnosis not present

## 2014-10-07 DIAGNOSIS — Z1211 Encounter for screening for malignant neoplasm of colon: Secondary | ICD-10-CM

## 2014-10-07 MED ORDER — SODIUM CHLORIDE 0.9 % IV SOLN: 500.0000 mL | INTRAVENOUS | Status: DC

## 2014-10-07 NOTE — Progress Notes (Signed)
A/ox3 pleased with MAC, report to Annette RN 

## 2014-10-07 NOTE — Op Note (Signed)
[place]  [address]. [place], [address]   COLONOSCOPY PROCEDURE REPORT  PATIENT: Roger Wagner, Roger Wagner  MR#: [id number] BIRTHDATE: 07-18-1961 , 53  yrs. old GENDER: male ENDOSCOPIST: Jerene Bears, MD REFERRED SH:FWYOVZ Evalina Field, M.D. PROCEDURE DATE:  10/07/2014 PROCEDURE:   Colonoscopy with snare polypectomy First Screening Colonoscopy - Avg.  risk and is 50 yrs.  old or older Yes.  Prior Negative Screening - Now for repeat screening. N/A  History of Adenoma - Now for follow-up colonoscopy & has been > or = to 3 yrs.  N/A  Polyps Removed Today? Yes. ASA CLASS:   Class II INDICATIONS:average risk for colon cancer and first colonoscopy. MEDICATIONS: Monitored anesthesia care and Propofol 500 mg IV  DESCRIPTION OF PROCEDURE:   After the risks benefits and alternatives of the procedure were thoroughly explained, informed consent was obtained.  The digital rectal exam revealed no rectal mass.   The LB [id number]  endoscope was introduced through the anus and advanced to the cecum, which was identified by both the appendix and ileocecal valve. No adverse events experienced. The quality of the prep was good, using MoviPrep  The instrument was then slowly withdrawn as the colon was fully examined.  COLON FINDINGS: A pedunculated polyp which was oozing measuring 30 mm in size was found in the sigmoid colon.  A 1:10,000 Epinephrine solution injection (3cc) was given to deliver sclerosing therapy during scope insertion.  A polypectomy was performed during withdrawal using snare cautery.  The resection was complete, the polyp tissue was completely retrieved and sent to histology.  The polypectomy base was closed by placing 1 hemoclip for bleeding prophylaxis.  There was no blood loss from maneuver.  The polypectomy site was tattooed at the proximal and distal margin.  A sessile polyp measuring 5 mm in size was found in the sigmoid colon.  A polypectomy was performed  with a cold snare.   The examination was otherwise normal.  Retroflexed views revealed internal hemorrhoids. The time to cecum=11 minutes 02 seconds. Withdrawal time=17 minutes 23 seconds.  The scope was withdrawn and the procedure completed.  COMPLICATIONS: There were no immediate complications.  ENDOSCOPIC IMPRESSION: 1.   Pedunculated polyp was found in the sigmoid colon; injection was given to deliver sclerosing therapy; polypectomy was performed using snare cautery; the wound at the site was closed by placing hemoclips; a tattoo was applied 2.   Sessile polyp was found in the sigmoid colon; polypectomy was performed with a cold snare 3.   The examination was otherwise normal  RECOMMENDATIONS: 1.  Hold Aspirin and all other NSAIDS for 2 weeks. 2.  Await pathology results 3.  Timing of repeat colonoscopy will be determined by pathology findings. 4.  You will receive a letter within 1-2 weeks with the results of your biopsy as well as final recommendations.  Please call my office if you have not received a letter after 3 weeks.  eSigned:  Jerene Bears, MD 10/07/2014 8:49 AM   cc: Janith Lima, MD and The Patient   PATIENT NAME:  Roger Wagner, Roger Wagner MR#: [id number]

## 2014-10-07 NOTE — Patient Instructions (Signed)
YOU HAD AN ENDOSCOPIC PROCEDURE TODAY AT THE [facility]: Refer to the procedure report that was given to you for any specific questions about what was found during the examination.  If the procedure report does not answer your questions, please call your gastroenterologist to clarify.  If you requested that your care partner not be given the details of your procedure findings, then the procedure report has been included in a sealed envelope for you to review at your convenience later.  YOU SHOULD EXPECT: Some feelings of bloating in the abdomen. Passage of more gas than usual.  Walking can help get rid of the air that was put into your GI tract during the procedure and reduce the bloating. If you had a lower endoscopy (such as a colonoscopy or flexible sigmoidoscopy) you may notice spotting of blood in your stool or on the toilet paper. If you underwent a bowel prep for your procedure, then you may not have a normal bowel movement for a few days.  DIET: Your first meal following the procedure should be a light meal and then it is ok to progress to your normal diet.  A half-sandwich or bowl of soup is an example of a good first meal.  Heavy or fried foods are harder to digest and may make you feel nauseous or bloated.  Likewise meals heavy in dairy and vegetables can cause extra gas to form and this can also increase the bloating.  Drink plenty of fluids but you should avoid alcoholic beverages for 24 hours.  ACTIVITY: Your care partner should take you home directly after the procedure.  You should plan to take it easy, moving slowly for the rest of the day.  You can resume normal activity the day after the procedure however you should NOT DRIVE or use heavy machinery for 24 hours (because of the sedation medicines used during the test).    SYMPTOMS TO REPORT IMMEDIATELY: A gastroenterologist can be reached at any hour.  During normal business hours, 8:30 AM to 5:00 PM Monday through Friday,  call [phone].  After hours and on weekends, please call the GI answering service at [phone] who will take a message and have the physician on call contact you.   Following lower endoscopy (colonoscopy or flexible sigmoidoscopy):  Excessive amounts of blood in the stool  Significant tenderness or worsening of abdominal pains  Swelling of the abdomen that is new, acute  Fever of 100F or higher  FOLLOW UP: If any biopsies were taken you will be contacted by phone or by letter within the next 1-3 weeks.  Call your gastroenterologist if you have not heard about the biopsies in 3 weeks.  Our staff will call the home number listed on your records the next business day following your procedure to check on you and address any questions or concerns that you may have at that time regarding the information given to you following your procedure. This is a courtesy call and so if there is no answer at the home number and we have not heard from you through the emergency physician on call, we will assume that you have returned to your regular daily activities without incident.  SIGNATURES/CONFIDENTIALITY: You and/or your care partner have signed paperwork which will be entered into your electronic medical record.  These signatures attest to the fact that that the information above on your After Visit Summary has been reviewed and is understood.  Full responsibility of the confidentiality of this   discharge information lies with you and/or your care-partner.   Handout was given to your care partner on polyps. You may resume your current medications today except hold aspirin and all other NSAIDS for 2 weeks. Await biopsy results. Please call if any questions or concerns.

## 2014-10-07 NOTE — Progress Notes (Signed)
No problems noted in the recovery room. Maw  The pt's wife asked that Dr. Hilarie Fredrickson call the pt's cell number with path results ASAP [phone].  Dr. Hilarie Fredrickson advised.  Path sepcimen was sent [facility].  [facility] called. maw

## 2014-10-07 NOTE — Progress Notes (Signed)
Called to room to assist during endoscopic procedure.  Patient ID and intended procedure confirmed with present staff. Received instructions for my participation in the procedure from the performing physician.  

## 2014-10-08 ENCOUNTER — Telehealth: Payer: Self-pay | Admitting: Internal Medicine

## 2014-10-08 NOTE — Telephone Encounter (Signed)
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

## 2014-10-12 ENCOUNTER — Telehealth: Payer: Self-pay | Admitting: *Deleted

## 2014-10-12 NOTE — Telephone Encounter (Signed)
  Follow up Call-  Call back number 10/07/2014  Post procedure Call Back phone  # [phone]  Permission to leave phone message Yes     Patient questions:  Do you have a fever, pain , or abdominal swelling? No. Pain Score  0 *  Have you tolerated food without any problems? Yes.    Have you been able to return to your normal activities? Yes.    Do you have any questions about your discharge instructions: Diet   No. Medications  No. Follow up visit  No.  Do you have questions or concerns about your Care? No.  Actions: * If pain score is 4 or above: No action needed, pain <4.

## 2014-10-19 ENCOUNTER — Encounter: Payer: Self-pay | Admitting: Internal Medicine

## 2014-10-20 LAB — HM COLONOSCOPY

## 2014-10-20 NOTE — Addendum Note (Signed)
Addended by: Janith Lima on: 10/20/2014 04:06 PM   Modules accepted: Miquel Dunn

## 2015-02-02 ENCOUNTER — Telehealth: Payer: Self-pay | Admitting: Internal Medicine

## 2015-02-02 MED ORDER — AZITHROMYCIN 500 MG PO TABS: 500.0000 mg | ORAL_TABLET | Freq: Every day | ORAL | Status: DC

## 2015-02-02 NOTE — Telephone Encounter (Signed)
Resent script to [facility]...Roger Wagner

## 2015-02-02 NOTE — Telephone Encounter (Signed)
Patients wife is requesting zithromax to be sent to Jacky Kindle at [facility] from [place] at [place].

## 2015-02-02 NOTE — Telephone Encounter (Signed)
Has sinus infection and cough and wanting to know if you can call something in at [facility]

## 2015-02-02 NOTE — Telephone Encounter (Signed)
Called pt and told her it was approved

## 2015-02-02 NOTE — Telephone Encounter (Signed)
done

## 2015-05-11 DIAGNOSIS — M19132 Post-traumatic osteoarthritis, left wrist: Secondary | ICD-10-CM | POA: Insufficient documentation

## 2015-05-11 DIAGNOSIS — G5602 Carpal tunnel syndrome, left upper limb: Secondary | ICD-10-CM | POA: Insufficient documentation

## 2015-12-07 ENCOUNTER — Other Ambulatory Visit: Payer: Self-pay | Admitting: Physician Assistant

## 2015-12-07 DIAGNOSIS — M2392 Unspecified internal derangement of left knee: Secondary | ICD-10-CM

## 2015-12-21 ENCOUNTER — Ambulatory Visit (HOSPITAL_COMMUNITY): Payer: BLUE CROSS/BLUE SHIELD

## 2015-12-24 ENCOUNTER — Ambulatory Visit: Payer: PRIVATE HEALTH INSURANCE

## 2016-01-24 ENCOUNTER — Encounter: Payer: Self-pay | Admitting: Internal Medicine

## 2016-01-24 ENCOUNTER — Other Ambulatory Visit (INDEPENDENT_AMBULATORY_CARE_PROVIDER_SITE_OTHER): Payer: BLUE CROSS/BLUE SHIELD

## 2016-01-24 ENCOUNTER — Ambulatory Visit (INDEPENDENT_AMBULATORY_CARE_PROVIDER_SITE_OTHER): Payer: BLUE CROSS/BLUE SHIELD | Admitting: Internal Medicine

## 2016-01-24 VITALS — BP 110/80 | HR 63 | Temp 98.3°F | Resp 16 | Ht 72.0 in | Wt 201.0 lb

## 2016-01-24 DIAGNOSIS — Z0000 Encounter for general adult medical examination without abnormal findings: Secondary | ICD-10-CM

## 2016-01-24 DIAGNOSIS — F411 Generalized anxiety disorder: Secondary | ICD-10-CM

## 2016-01-24 LAB — COMPREHENSIVE METABOLIC PANEL
ALBUMIN: 4.5 g/dL (ref 3.5–5.2)
ALT: 18 U/L (ref 0–53)
AST: 20 U/L (ref 0–37)
Alkaline Phosphatase: 96 U/L (ref 39–117)
BILIRUBIN TOTAL: 0.7 mg/dL (ref 0.2–1.2)
BUN: 22 mg/dL (ref 6–23)
CALCIUM: 9.7 mg/dL (ref 8.4–10.5)
CO2: 29 meq/L (ref 19–32)
CREATININE: 1.03 mg/dL (ref 0.40–1.50)
Chloride: 103 mEq/L (ref 96–112)
GFR: 79.52 mL/min (ref >60.00)
Glucose, Bld: 106 mg/dL — ABNORMAL HIGH (ref 70–99)
Potassium: 4.3 mEq/L (ref 3.5–5.1)
Sodium: 140 mEq/L (ref 135–145)
TOTAL PROTEIN: 7.1 g/dL (ref 6.0–8.3)

## 2016-01-24 LAB — CBC WITH DIFFERENTIAL/PLATELET
BASOS ABS: 0 10*3/uL (ref 0.0–0.1)
Basophils Relative: 0.8 % (ref 0.0–3.0)
EOS ABS: 0.2 10*3/uL (ref 0.0–0.7)
Eosinophils Relative: 4.1 % (ref 0.0–5.0)
HEMATOCRIT: 39.6 % (ref 39.0–52.0)
HEMOGLOBIN: 13.7 g/dL (ref 13.0–17.0)
LYMPHS PCT: 28.6 % (ref 12.0–46.0)
Lymphs Abs: 1.3 10*3/uL (ref 0.7–4.0)
MCHC: 34.6 g/dL (ref 30.0–36.0)
MCV: 85 fl (ref 78.0–100.0)
MONOS PCT: 8 % (ref 3.0–12.0)
Monocytes Absolute: 0.4 10*3/uL (ref 0.1–1.0)
Neutro Abs: 2.7 10*3/uL (ref 1.4–7.7)
Neutrophils Relative %: 58.5 % (ref 43.0–77.0)
Platelets: 295 10*3/uL (ref 150.0–400.0)
RBC: 4.66 Mil/uL (ref 4.22–5.81)
RDW: 12.9 % (ref 11.5–15.5)
WBC: 4.7 10*3/uL (ref 4.0–10.5)

## 2016-01-24 LAB — LIPID PANEL
CHOLESTEROL: 174 mg/dL (ref 0–200)
HDL: 46.5 mg/dL (ref >39.00)
LDL Cholesterol: 105 mg/dL — ABNORMAL HIGH (ref 0–99)
NonHDL: 127.27
Total CHOL/HDL Ratio: 4
Triglycerides: 113 mg/dL (ref 0.0–149.0)
VLDL: 22.6 mg/dL (ref 0.0–40.0)

## 2016-01-24 LAB — PSA: PSA: 1.15 ng/mL (ref 0.10–4.00)

## 2016-01-24 LAB — FECAL OCCULT BLOOD, GUAIAC: Fecal Occult Blood: NEGATIVE

## 2016-01-24 LAB — TSH: TSH: 2.08 u[IU]/mL (ref 0.35–4.50)

## 2016-01-24 MED ORDER — CLONAZEPAM 1 MG PO TABS: ORAL_TABLET | ORAL | Status: DC

## 2016-01-24 NOTE — Progress Notes (Signed)
Subjective:  Patient ID: Roger Wagner, male    DOB: June 26, 1961  Age: 55 y.o. MRN: [id number]  CC: Annual Exam and Anxiety   HPI Roger Wagner presents for a CPX - he also requests a refill on klonopin, he takes this about 2-3 times a month for anxiety and panic. He otherwise feels well and offers no other complaints.  Outpatient Prescriptions Prior to Visit  Medication Sig Dispense Refill  . Multiple Vitamin (MULTIVITAMIN WITH MINERALS) TABS tablet Take 1 tablet by mouth daily.    . clonazePAM (KLONOPIN) 1 MG tablet TAKE ONE-HALF TO ONE TABLET BY MOUTH TWICE DAILY AS NEEDED FOR ANXIETY 60 tablet 5  . azithromycin (ZITHROMAX) 500 MG tablet Take 1 tablet (500 mg total) by mouth daily. 3 tablet 0  . clindamycin (CLINDAGEL) 1 % gel Apply topically 2 (two) times daily. (Patient taking differently: Apply topically as needed. ) 30 g 5   No facility-administered medications prior to visit.    ROS Review of Systems  Constitutional: Negative.   HENT: Negative.   Eyes: Negative.   Respiratory: Negative.  Negative for cough, choking, shortness of breath and stridor.   Cardiovascular: Negative.  Negative for chest pain, palpitations and leg swelling.  Gastrointestinal: Negative.  Negative for nausea, vomiting, abdominal pain, diarrhea and constipation.  Endocrine: Negative.   Genitourinary: Negative.  Negative for urgency, scrotal swelling, difficulty urinating and testicular pain.  Musculoskeletal: Negative.   Allergic/Immunologic: Negative.   Neurological: Negative.  Negative for dizziness, weakness and light-headedness.  Hematological: Negative.  Negative for adenopathy. Does not bruise/bleed easily.  Psychiatric/Behavioral: Negative for confusion, sleep disturbance, self-injury, dysphoric mood and decreased concentration. The patient is nervous/anxious.     Objective:  BP 110/80 mmHg  Pulse 63  Temp(Src) 98.3 F (36.8 C) (Oral)  Resp 16  Ht 6' (1.829 m)  Wt 201 lb (91.173  kg)  BMI 27.25 kg/m2  SpO2 97%  BP Readings from Last 3 Encounters:  01/24/16 110/80  10/07/14 118/77  08/13/14 120/68    Wt Readings from Last 3 Encounters:  01/24/16 201 lb (91.173 kg)  10/07/14 197 lb (89.359 kg)  09/22/14 197 lb 3.2 oz (89.449 kg)    Physical Exam  Constitutional: He is oriented to person, place, and time. He appears well-developed and well-nourished. No distress.  HENT:  Mouth/Throat: Oropharynx is clear and moist. No oropharyngeal exudate.  Eyes: Conjunctivae are normal. Right eye exhibits no discharge. Left eye exhibits no discharge. No scleral icterus.  Neck: Normal range of motion. Neck supple. No JVD present. No tracheal deviation present. No thyromegaly present.  Cardiovascular: Normal rate, regular rhythm, normal heart sounds and intact distal pulses.  Exam reveals no gallop and no friction rub.   No murmur heard. Pulmonary/Chest: Effort normal and breath sounds normal. No stridor. No respiratory distress. He has no wheezes. He has no rales. He exhibits no tenderness.  Abdominal: Soft. Bowel sounds are normal. He exhibits no distension and no mass. There is no tenderness. There is no rebound and no guarding. Hernia confirmed negative in the right inguinal area and confirmed negative in the left inguinal area.  Genitourinary: Rectum normal, testes normal and penis normal. Rectal exam shows no external hemorrhoid, no internal hemorrhoid, no fissure, no mass, no tenderness and anal tone normal. Guaiac negative stool. Prostate is enlarged (1+ smooth symm BPH). Prostate is not tender. Right testis shows no mass and no swelling. Right testis is descended. Left testis shows no mass, no swelling and no  tenderness. Left testis is descended. Circumcised. No penile erythema or penile tenderness. No discharge found.  Musculoskeletal: Normal range of motion. He exhibits no edema or tenderness.  Lymphadenopathy:    He has no cervical adenopathy.       Right: No inguinal  adenopathy present.       Left: No inguinal adenopathy present.  Neurological: He is oriented to person, place, and time.  Skin: Skin is warm and dry. No rash noted. He is not diaphoretic. No erythema. No pallor.  Vitals reviewed.   Lab Results  Component Value Date   WBC 4.7 01/24/2016   HGB 13.7 01/24/2016   HCT 39.6 01/24/2016   PLT 295.0 01/24/2016   GLUCOSE 106* 01/24/2016   CHOL 174 01/24/2016   TRIG 113.0 01/24/2016   HDL 46.50 01/24/2016   LDLCALC 105* 01/24/2016   ALT 18 01/24/2016   AST 20 01/24/2016   NA 140 01/24/2016   K 4.3 01/24/2016   CL 103 01/24/2016   CREATININE 1.03 01/24/2016   BUN 22 01/24/2016   CO2 29 01/24/2016   TSH 2.08 01/24/2016   PSA 1.15 01/24/2016    Ct Abdomen Pelvis Wo Contrast  07/24/2014  CLINICAL DATA:  RIGHT flank pain yesterday subsiding through night then starting at 0500 hr again, urinary frequency, nausea, vomiting, hematuria EXAM: CT ABDOMEN AND PELVIS WITHOUT CONTRAST TECHNIQUE: Multidetector CT imaging of the abdomen and pelvis was performed following the standard protocol without IV contrast. Sagittal and coronal MPR images reconstructed from axial data set. Oral contrast not administered for this indication. COMPARISON:  None FINDINGS: Lung bases clear. RIGHT hydronephrosis and hydroureter. Perinephric and proximal periureteral edema. 1-2 mm diameter calculus at RIGHT ureterovesical junction. No additional urinary tract calcification or LEFT-sided urinary tract dilatation. Minimal prostatic enlargement. Slightly higher attenuation of urinary bladder question due to hematuria. Within limits of a nonenhanced exam, liver, spleen, pancreas, kidneys, and adrenal glands normal appearance. Stomach and bowel loops unremarkable for technique. Normal appendix. Small umbilical hernia containing fat. No mass, adenopathy, free fluid or inflammatory process. Mild scattered degenerative disc disease changes lumbar spine. IMPRESSION: Mild RIGHT  hydronephrosis and hydroureter secondary to a 1-2 mm diameter calculus at the RIGHT ureterovesical junction. Electronically Signed   By: Lavonia Dana M.D.   On: 07/24/2014 09:21    Assessment & Plan:   [name] was seen today for annual exam and anxiety.  Diagnoses and all orders for this visit:  GAD (generalized anxiety disorder)- he rarely takes this and is on insulin taking an SSRI, will continue as needed. He exhibits no signs or symptoms of abuse or dependence. -     clonazePAM (KLONOPIN) 1 MG tablet; TAKE ONE-HALF TO ONE TABLET BY MOUTH TWICE DAILY AS NEEDED FOR ANXIETY  Routine general medical examination at a health care facility- Exam completed, labs ordered and reviewed, vaccines reviewed and updated, patient education material was given, his colonoscopy is up-to-date. -     Comprehensive metabolic panel; Future -     CBC with Differential/Platelet; Future -     PSA; Future -     TSH; Future -     Lipid panel; Future  I have discontinued Mr. Bolitho clindamycin and azithromycin. I am also having him maintain his multivitamin with minerals and clonazePAM.  Meds ordered this encounter  Medications  . clonazePAM (KLONOPIN) 1 MG tablet    Sig: TAKE ONE-HALF TO ONE TABLET BY MOUTH TWICE DAILY AS NEEDED FOR ANXIETY    Dispense:  60 tablet  Refill:  5     Follow-up: Return if symptoms worsen or fail to improve.  Scarlette Calico, MD

## 2016-01-24 NOTE — Patient Instructions (Signed)

## 2016-04-20 DIAGNOSIS — H52203 Unspecified astigmatism, bilateral: Secondary | ICD-10-CM | POA: Diagnosis not present

## 2016-04-20 DIAGNOSIS — H5203 Hypermetropia, bilateral: Secondary | ICD-10-CM | POA: Diagnosis not present

## 2016-04-20 DIAGNOSIS — H524 Presbyopia: Secondary | ICD-10-CM | POA: Diagnosis not present

## 2016-06-22 ENCOUNTER — Other Ambulatory Visit: Payer: Self-pay | Admitting: Internal Medicine

## 2016-10-05 DIAGNOSIS — G5602 Carpal tunnel syndrome, left upper limb: Secondary | ICD-10-CM | POA: Diagnosis not present

## 2016-10-05 DIAGNOSIS — M19132 Post-traumatic osteoarthritis, left wrist: Secondary | ICD-10-CM | POA: Diagnosis not present

## 2016-10-05 DIAGNOSIS — M67432 Ganglion, left wrist: Secondary | ICD-10-CM | POA: Diagnosis not present

## 2017-08-07 ENCOUNTER — Other Ambulatory Visit (INDEPENDENT_AMBULATORY_CARE_PROVIDER_SITE_OTHER): Payer: BLUE CROSS/BLUE SHIELD

## 2017-08-07 ENCOUNTER — Ambulatory Visit (INDEPENDENT_AMBULATORY_CARE_PROVIDER_SITE_OTHER): Payer: BLUE CROSS/BLUE SHIELD | Admitting: Internal Medicine

## 2017-08-07 ENCOUNTER — Encounter: Payer: Self-pay | Admitting: Internal Medicine

## 2017-08-07 VITALS — BP 134/84 | HR 72 | Temp 98.5°F | Resp 16 | Ht 72.0 in | Wt 198.0 lb

## 2017-08-07 DIAGNOSIS — R739 Hyperglycemia, unspecified: Secondary | ICD-10-CM

## 2017-08-07 DIAGNOSIS — Z0000 Encounter for general adult medical examination without abnormal findings: Secondary | ICD-10-CM

## 2017-08-07 DIAGNOSIS — Z23 Encounter for immunization: Secondary | ICD-10-CM | POA: Diagnosis not present

## 2017-08-07 LAB — LIPID PANEL
CHOLESTEROL: 182 mg/dL (ref 0–200)
HDL: 53.9 mg/dL (ref >39.00)
LDL Cholesterol: 97 mg/dL (ref 0–99)
NonHDL: 128.06
Total CHOL/HDL Ratio: 3
Triglycerides: 154 mg/dL — ABNORMAL HIGH (ref 0.0–149.0)
VLDL: 30.8 mg/dL (ref 0.0–40.0)

## 2017-08-07 LAB — COMPREHENSIVE METABOLIC PANEL
ALT: 21 U/L (ref 0–53)
AST: 20 U/L (ref 0–37)
Albumin: 4.5 g/dL (ref 3.5–5.2)
Alkaline Phosphatase: 70 U/L (ref 39–117)
BILIRUBIN TOTAL: 0.8 mg/dL (ref 0.2–1.2)
BUN: 22 mg/dL (ref 6–23)
CALCIUM: 10 mg/dL (ref 8.4–10.5)
CO2: 29 meq/L (ref 19–32)
CREATININE: 1.04 mg/dL (ref 0.40–1.50)
Chloride: 103 mEq/L (ref 96–112)
GFR: 78.2 mL/min (ref >60.00)
GLUCOSE: 113 mg/dL — AB (ref 70–99)
Potassium: 4.3 mEq/L (ref 3.5–5.1)
Sodium: 139 mEq/L (ref 135–145)
Total Protein: 7.1 g/dL (ref 6.0–8.3)

## 2017-08-07 LAB — HEMOGLOBIN A1C: Hgb A1c MFr Bld: 5.3 % (ref 4.6–6.5)

## 2017-08-07 LAB — PSA: PSA: 1.16 ng/mL (ref 0.10–4.00)

## 2017-08-07 NOTE — Progress Notes (Signed)
Subjective:  Patient ID: Roger Wagner, male    DOB: [birth year]  Age: 56 y.o. MRN: [id number]  CC: Annual Exam   HPI Roger Wagner presents for a CPX - he feels well and offers no complaints.  He is very active and denies CP, DOE, S OB, palpitations, edema, or fatigue.  Outpatient Medications Prior to Visit  Medication Sig Dispense Refill  . Multiple Vitamin (MULTIVITAMIN WITH MINERALS) TABS tablet Take 1 tablet by mouth daily.     No facility-administered medications prior to visit.     ROS Review of Systems  Constitutional: Negative.  Negative for diaphoresis and fatigue.  HENT: Negative.   Eyes: Negative.   Respiratory: Negative.  Negative for cough, chest tightness, shortness of breath and wheezing.   Cardiovascular: Negative.  Negative for chest pain, palpitations and leg swelling.  Gastrointestinal: Negative for abdominal pain, constipation, diarrhea, nausea and vomiting.  Endocrine: Negative.   Genitourinary: Negative.  Negative for difficulty urinating, penile pain, penile swelling, scrotal swelling and testicular pain.  Musculoskeletal: Negative.  Negative for back pain and myalgias.  Skin: Negative.   Allergic/Immunologic: Negative.   Neurological: Negative.  Negative for dizziness.  Hematological: Negative for adenopathy. Does not bruise/bleed easily.  Psychiatric/Behavioral: Negative.     Objective:  BP 134/84 (BP Location: Left Arm, Patient Position: Sitting, Cuff Size: Normal)   Pulse 72   Temp 98.5 F (36.9 C) (Oral)   Resp 16   Ht 6' (1.829 m)   Wt 198 lb (89.8 kg)   SpO2 98%   BMI 26.85 kg/m   BP Readings from Last 3 Encounters:  08/07/17 134/84  01/24/16 110/80  10/07/14 118/77    Wt Readings from Last 3 Encounters:  08/07/17 198 lb (89.8 kg)  01/24/16 201 lb (91.2 kg)  10/07/14 197 lb (89.4 kg)    Physical Exam  Constitutional: He is oriented to person, place, and time. No distress.  HENT:  Mouth/Throat: Oropharynx is clear and  moist. No oropharyngeal exudate.  Eyes: Conjunctivae are normal. Right eye exhibits no discharge. Left eye exhibits no discharge. No scleral icterus.  Neck: Normal range of motion. Neck supple. No JVD present. No thyromegaly present.  Cardiovascular: Normal rate, regular rhythm and intact distal pulses.  Exam reveals no gallop and no friction rub.   No murmur heard. Pulmonary/Chest: Effort normal and breath sounds normal. No respiratory distress. He has no wheezes. He has no rales. He exhibits no tenderness.  Abdominal: Soft. Bowel sounds are normal. He exhibits no distension and no mass. There is no tenderness. There is no rebound and no guarding. Hernia confirmed negative in the right inguinal area and confirmed negative in the left inguinal area.  Genitourinary: Rectum normal, prostate normal, testes normal and penis normal. Rectal exam shows no external hemorrhoid, no internal hemorrhoid, no fissure, no mass, no tenderness, anal tone normal and guaiac negative stool. Prostate is not enlarged and not tender. Right testis shows no mass, no swelling and no tenderness. Right testis is descended. Left testis shows no mass, no swelling and no tenderness. Left testis is descended. Circumcised. No hypospadias or penile tenderness. No discharge found.  Musculoskeletal: Normal range of motion. He exhibits no edema, tenderness or deformity.  Lymphadenopathy:    He has no cervical adenopathy.       Right: No inguinal adenopathy present.       Left: No inguinal adenopathy present.  Neurological: He is alert and oriented to person, place, and time.  Skin: Skin  is warm and dry. No rash noted. He is not diaphoretic. No erythema. No pallor.  Vitals reviewed.   Lab Results  Component Value Date   WBC 4.7 01/24/2016   HGB 13.7 01/24/2016   HCT 39.6 01/24/2016   PLT 295.0 01/24/2016   GLUCOSE 113 (H) 08/07/2017   CHOL 182 08/07/2017   TRIG 154.0 (H) 08/07/2017   HDL 53.90 08/07/2017   LDLCALC 97  08/07/2017   ALT 21 08/07/2017   AST 20 08/07/2017   NA 139 08/07/2017   K 4.3 08/07/2017   CL 103 08/07/2017   CREATININE 1.04 08/07/2017   BUN 22 08/07/2017   CO2 29 08/07/2017   TSH 2.08 01/24/2016   PSA 1.16 08/07/2017   HGBA1C 5.3 08/07/2017    Ct Abdomen Pelvis Wo Contrast  Result Date: 07/24/2014 CLINICAL DATA:  RIGHT flank pain yesterday subsiding through night then starting at 0500 hr again, urinary frequency, nausea, vomiting, hematuria EXAM: CT ABDOMEN AND PELVIS WITHOUT CONTRAST TECHNIQUE: Multidetector CT imaging of the abdomen and pelvis was performed following the standard protocol without IV contrast. Sagittal and coronal MPR images reconstructed from axial data set. Oral contrast not administered for this indication. COMPARISON:  None FINDINGS: Lung bases clear. RIGHT hydronephrosis and hydroureter. Perinephric and proximal periureteral edema. 1-2 mm diameter calculus at RIGHT ureterovesical junction. No additional urinary tract calcification or LEFT-sided urinary tract dilatation. Minimal prostatic enlargement. Slightly higher attenuation of urinary bladder question due to hematuria. Within limits of a nonenhanced exam, liver, spleen, pancreas, kidneys, and adrenal glands normal appearance. Stomach and bowel loops unremarkable for technique. Normal appendix. Small umbilical hernia containing fat. No mass, adenopathy, free fluid or inflammatory process. Mild scattered degenerative disc disease changes lumbar spine. IMPRESSION: Mild RIGHT hydronephrosis and hydroureter secondary to a 1-2 mm diameter calculus at the RIGHT ureterovesical junction. Electronically Signed   By: Lavonia Dana M.D.   On: 07/24/2014 09:21    Assessment & Plan:   Roger Wagner was seen today for annual exam.  Diagnoses and all orders for this visit:  Routine general medical examination at a health care facility-exam completed, labs reviewed, vaccines reviewed and updated, colon cancer screening is up-to-date,  patient education material was given. -     Lipid panel; Future -     PSA; Future  Hyperglycemia-he is mildly prediabetic.  He will improve his diet and lifestyle modifications. -     Hemoglobin A1c; Future -     Comprehensive metabolic panel; Future  Need for Tdap vaccination -     Tdap vaccine greater than or equal to 7yo IM   I am having Mr. Hodkinson maintain his multivitamin with minerals.  No orders of the defined types were placed in this encounter.    Follow-up: Return if symptoms worsen or fail to improve.  Scarlette Calico, MD

## 2017-08-07 NOTE — Patient Instructions (Signed)

## 2017-08-20 ENCOUNTER — Encounter: Payer: Self-pay | Admitting: *Deleted

## 2017-10-19 ENCOUNTER — Encounter: Payer: Self-pay | Admitting: Internal Medicine

## 2017-10-30 DIAGNOSIS — M67432 Ganglion, left wrist: Secondary | ICD-10-CM | POA: Diagnosis not present

## 2017-10-30 DIAGNOSIS — G5602 Carpal tunnel syndrome, left upper limb: Secondary | ICD-10-CM | POA: Diagnosis not present

## 2017-11-20 ENCOUNTER — Encounter
Admission: RE | Admit: 2017-11-20 | Discharge: 2017-11-20 | Disposition: A | Discharge status: Home / Self Care | Payer: BLUE CROSS/BLUE SHIELD | Source: Ambulatory Visit | Attending: Orthopedic Surgery | Admitting: Orthopedic Surgery

## 2017-11-20 ENCOUNTER — Other Ambulatory Visit: Payer: Self-pay

## 2017-11-20 HISTORY — DX: Other complications of anesthesia, initial encounter: T88.59XA

## 2017-11-20 HISTORY — DX: Unspecified osteoarthritis, unspecified site: M19.90

## 2017-11-20 HISTORY — DX: Personal history of urinary calculi: Z87.442

## 2017-11-20 HISTORY — DX: Adverse effect of unspecified anesthetic, initial encounter: T41.45XA

## 2017-11-20 NOTE — Patient Instructions (Signed)
  Your procedure is scheduled on: 11-28-17  Report to Same Day Surgery 2nd floor medical mall [facility] Entrance-take elevator on left to 2nd floor.  Check in with surgery information desk.) To find out your arrival time please call [phone] between 1PM - 3PM on 11-27-17  Remember: Instructions that are not followed completely may result in serious medical risk, up to and including death, or upon the discretion of your surgeon and anesthesiologist your surgery may need to be rescheduled.    _x___ 1. Do not eat food after midnight the night before your procedure. NO GUM OR CANDY AFTER MIDNIGHT.  You may drink clear liquids up to 2 hours before you are scheduled to arrive at the hospital for your procedure.  Do not drink clear liquids within 2 hours of your scheduled arrival to the hospital.  Clear liquids include  --Water or Apple juice without pulp  --Clear carbohydrate beverage such as ClearFast or Gatorade  --Black Coffee or Clear Tea (No milk, no creamers, do not add anything to the coffee or Tea    __x__ 2. No Alcohol for 24 hours before or after surgery.   __x__3. No Smoking for 24 prior to surgery.   ____  4. Bring all medications with you on the day of surgery if instructed.    __x__ 5. Notify your doctor if there is any change in your medical condition     (cold, fever, infections).     Do not wear jewelry, make-up, hairpins, clips or nail polish.  Do not wear lotions, powders, or perfumes. You may wear deodorant.  Do not shave 48 hours prior to surgery. Men may shave face and neck.  Do not bring valuables to the hospital.    [facility] is not responsible for any belongings or valuables.               Contacts, dentures or bridgework may not be worn into surgery.  Leave your suitcase in the car. After surgery it may be brought to your room.  For patients admitted to the hospital, discharge time is determined by your treatment team.   Patients discharged the day of  surgery will not be allowed to drive home.  You will need someone to drive you home and stay with you the night of your procedure.    Please read over the following fact sheets that you were given:   [facility] Preparing for Surgery and or MRSA Information   ____ Take anti-hypertensive listed below, cardiac, seizure, asthma, anti-reflux and psychiatric medicines. These include:  1. NONE  2.  3.  4.  5.  6.  ____Fleets enema or Magnesium Citrate as directed.   ____ Use CHG Soap or sage wipes as directed on instruction sheet   ____ Use inhalers on the day of surgery and bring to hospital day of surgery  ____ Stop Metformin and Janumet 2 days prior to surgery.    ____ Take 1/2 of usual insulin dose the night before surgery and none on the morning surgery.   ____ Follow recommendations from Cardiologist, Pulmonologist or PCP regarding stopping Aspirin, Coumadin, Plavix ,Eliquis, Effient, or Pradaxa, and Pletal.  X____Stop Anti-inflammatories such as Advil, Aleve, Ibuprofen, Motrin, Naproxen, Naprosyn, Goodies powders or aspirin products NOW-OK to take Tylenol   ____ Stop supplements until after surgery.    ____ Bring C-Pap to the hospital.

## 2017-11-25 ENCOUNTER — Encounter: Payer: Self-pay | Admitting: Orthopedic Surgery

## 2017-11-28 ENCOUNTER — Other Ambulatory Visit: Payer: Self-pay

## 2017-11-28 ENCOUNTER — Ambulatory Visit: Payer: BLUE CROSS/BLUE SHIELD | Admitting: Anesthesiology

## 2017-11-28 ENCOUNTER — Ambulatory Visit
Admission: RE | Admit: 2017-11-28 | Discharge: 2017-11-28 | Disposition: A | Discharge status: Home / Self Care | Payer: BLUE CROSS/BLUE SHIELD | Source: Ambulatory Visit | Attending: Orthopedic Surgery | Admitting: Orthopedic Surgery

## 2017-11-28 ENCOUNTER — Encounter
Admission: RE | Disposition: A | Discharge status: Home / Self Care | Payer: Self-pay | Source: Ambulatory Visit | Attending: Orthopedic Surgery

## 2017-11-28 DIAGNOSIS — Z882 Allergy status to sulfonamides status: Secondary | ICD-10-CM | POA: Insufficient documentation

## 2017-11-28 DIAGNOSIS — G5602 Carpal tunnel syndrome, left upper limb: Secondary | ICD-10-CM | POA: Diagnosis not present

## 2017-11-28 DIAGNOSIS — M67432 Ganglion, left wrist: Secondary | ICD-10-CM | POA: Insufficient documentation

## 2017-11-28 HISTORY — PX: GANGLION CYST EXCISION: SHX1691

## 2017-11-28 HISTORY — PX: CARPAL TUNNEL RELEASE: SHX101

## 2017-11-28 SURGERY — CARPAL TUNNEL RELEASE
Anesthesia: General | Laterality: Left

## 2017-11-28 MED ORDER — LACTATED RINGERS IV SOLN
INTRAVENOUS | Status: DC | PRN
  Administered 2017-11-28: 14:00:00 via INTRAVENOUS

## 2017-11-28 MED ORDER — FENTANYL CITRATE (PF) 100 MCG/2ML IJ SOLN
INTRAMUSCULAR | Status: DC | PRN
  Administered 2017-11-28: 25 ug via INTRAVENOUS
  Administered 2017-11-28: 50 ug via INTRAVENOUS
  Administered 2017-11-28: 25 ug via INTRAVENOUS

## 2017-11-28 MED ORDER — ONDANSETRON HCL 4 MG PO TABS: 4.0000 mg | ORAL_TABLET | Freq: Four times a day (QID) | ORAL | Status: DC | PRN

## 2017-11-28 MED ORDER — FAMOTIDINE 20 MG PO TABS
20.0000 mg | ORAL_TABLET | Freq: Once | ORAL | Status: AC
  Administered 2017-11-28: 20 mg via ORAL

## 2017-11-28 MED ORDER — LACTATED RINGERS IV SOLN
INTRAVENOUS | Status: DC
  Administered 2017-11-28: 14:00:00 via INTRAVENOUS

## 2017-11-28 MED ORDER — LIDOCAINE HCL (CARDIAC) 20 MG/ML IV SOLN
INTRAVENOUS | Status: DC | PRN
  Administered 2017-11-28: 80 mg via INTRAVENOUS

## 2017-11-28 MED ORDER — METOCLOPRAMIDE HCL 5 MG/ML IJ SOLN: 5.0000 mg | Freq: Three times a day (TID) | INTRAMUSCULAR | Status: DC | PRN

## 2017-11-28 MED ORDER — ONDANSETRON HCL 4 MG/2ML IJ SOLN: 4.0000 mg | Freq: Once | INTRAMUSCULAR | Status: DC | PRN

## 2017-11-28 MED ORDER — METOCLOPRAMIDE HCL 10 MG PO TABS: 5.0000 mg | ORAL_TABLET | Freq: Three times a day (TID) | ORAL | Status: DC | PRN

## 2017-11-28 MED ORDER — HYDROCODONE-ACETAMINOPHEN 5-325 MG PO TABS: 1.0000 | ORAL_TABLET | ORAL | 0 refills | Status: DC | PRN

## 2017-11-28 MED ORDER — ONDANSETRON HCL 4 MG/2ML IJ SOLN
INTRAMUSCULAR | Status: DC | PRN
  Administered 2017-11-28: 4 mg via INTRAVENOUS

## 2017-11-28 MED ORDER — BUPIVACAINE HCL (PF) 0.25 % IJ SOLN
INTRAMUSCULAR | Status: AC
  Filled 2017-11-28: qty 30

## 2017-11-28 MED ORDER — SUCCINYLCHOLINE CHLORIDE 20 MG/ML IJ SOLN
INTRAMUSCULAR | Status: DC | PRN
  Administered 2017-11-28: 60 mg via INTRAVENOUS

## 2017-11-28 MED ORDER — PROPOFOL 10 MG/ML IV BOLUS
INTRAVENOUS | Status: DC | PRN
  Administered 2017-11-28: 40 mg via INTRAVENOUS
  Administered 2017-11-28: 160 mg via INTRAVENOUS

## 2017-11-28 MED ORDER — MIDAZOLAM HCL 2 MG/2ML IJ SOLN
INTRAMUSCULAR | Status: AC
  Filled 2017-11-28: qty 2

## 2017-11-28 MED ORDER — CHLORHEXIDINE GLUCONATE 4 % EX LIQD: 60.0000 mL | Freq: Once | CUTANEOUS | Status: DC

## 2017-11-28 MED ORDER — FENTANYL CITRATE (PF) 100 MCG/2ML IJ SOLN: 25.0000 ug | INTRAMUSCULAR | Status: DC | PRN

## 2017-11-28 MED ORDER — FAMOTIDINE 20 MG PO TABS
ORAL_TABLET | ORAL | Status: AC
  Filled 2017-11-28: qty 1

## 2017-11-28 MED ORDER — NEOMYCIN-POLYMYXIN B GU 40-200000 IR SOLN
Status: DC | PRN
  Administered 2017-11-28: 2 mL

## 2017-11-28 MED ORDER — LIDOCAINE HCL (PF) 2 % IJ SOLN
INTRAMUSCULAR | Status: AC
  Filled 2017-11-28: qty 10

## 2017-11-28 MED ORDER — PROPOFOL 10 MG/ML IV BOLUS
INTRAVENOUS | Status: AC
  Filled 2017-11-28: qty 20

## 2017-11-28 MED ORDER — FENTANYL CITRATE (PF) 100 MCG/2ML IJ SOLN
INTRAMUSCULAR | Status: AC
  Filled 2017-11-28: qty 2

## 2017-11-28 MED ORDER — NEOMYCIN-POLYMYXIN B GU 40-200000 IR SOLN
Status: AC
  Filled 2017-11-28: qty 2

## 2017-11-28 MED ORDER — MIDAZOLAM HCL 2 MG/2ML IJ SOLN
INTRAMUSCULAR | Status: DC | PRN
  Administered 2017-11-28: 2 mg via INTRAVENOUS

## 2017-11-28 MED ORDER — ONDANSETRON HCL 4 MG/2ML IJ SOLN: 4.0000 mg | Freq: Four times a day (QID) | INTRAMUSCULAR | Status: DC | PRN

## 2017-11-28 MED ORDER — ONDANSETRON HCL 4 MG/2ML IJ SOLN
INTRAMUSCULAR | Status: AC
  Filled 2017-11-28: qty 2

## 2017-11-28 MED ORDER — BUPIVACAINE HCL (PF) 0.25 % IJ SOLN
INTRAMUSCULAR | Status: DC | PRN
  Administered 2017-11-28: 10 mL

## 2017-11-28 SURGICAL SUPPLY — 37 items
BANDAGE ELASTIC 3 LF NS (GAUZE/BANDAGES/DRESSINGS) ×2 IMPLANT
BNDG CMPR 75X41 PLY HI ABS (GAUZE/BANDAGES/DRESSINGS) ×1
BNDG CMPR MED 5X3 ELC HKLP NS (GAUZE/BANDAGES/DRESSINGS) ×1
BNDG ESMARK 4X12 TAN STRL LF (GAUZE/BANDAGES/DRESSINGS) ×2 IMPLANT
BNDG STRETCH 4X75 STRL LF (GAUZE/BANDAGES/DRESSINGS) ×2 IMPLANT
CANISTER SUCT 1200ML W/VALVE (MISCELLANEOUS) ×2 IMPLANT
CAST PADDING 3X4FT ST 30246 (SOFTGOODS) ×1
CUFF TOURN 18 STER (MISCELLANEOUS) ×2 IMPLANT
DRSG DERMACEA 8X12 NADH (GAUZE/BANDAGES/DRESSINGS) ×2 IMPLANT
DRSG TELFA 3X8 NADH (GAUZE/BANDAGES/DRESSINGS) ×2 IMPLANT
DURAPREP 26ML APPLICATOR (WOUND CARE) ×2 IMPLANT
ELECT CAUTERY BLADE 6.4 (BLADE) ×2 IMPLANT
ELECT REM PT RETURN 9FT ADLT (ELECTROSURGICAL) ×2
ELECTRODE REM PT RTRN 9FT ADLT (ELECTROSURGICAL) ×1 IMPLANT
GAUZE SPONGE 4X4 12PLY STRL (GAUZE/BANDAGES/DRESSINGS) ×2 IMPLANT
GLOVE BIO SURGEON STRL SZ8 (GLOVE) ×2 IMPLANT
GLOVE BIOGEL M STRL SZ7.5 (GLOVE) ×2 IMPLANT
GLOVE INDICATOR 8.0 STRL GRN (GLOVE) ×2 IMPLANT
GOWN STRL REUS W/ TWL LRG LVL3 (GOWN DISPOSABLE) ×2 IMPLANT
GOWN STRL REUS W/TWL LRG LVL3 (GOWN DISPOSABLE) ×4
KIT TURNOVER KIT A (KITS) ×2 IMPLANT
NS IRRIG 500ML POUR BTL (IV SOLUTION) ×2 IMPLANT
PACK EXTREMITY ARMC (MISCELLANEOUS) ×2 IMPLANT
PAD CAST CTTN 3X4 STRL (SOFTGOODS) ×1 IMPLANT
PAD DRESSING TELFA 3X8 NADH (GAUZE/BANDAGES/DRESSINGS) IMPLANT
PADDING CAST COTTON 3X4 STRL (SOFTGOODS) ×1
SOL PREP PVP 2OZ (MISCELLANEOUS) ×2
SOLUTION PREP PVP 2OZ (MISCELLANEOUS) ×1 IMPLANT
SPLINT CAST 1 STEP 3X12 (MISCELLANEOUS) ×2 IMPLANT
STOCKINETTE 48X4 2 PLY STRL (GAUZE/BANDAGES/DRESSINGS) ×1 IMPLANT
STOCKINETTE BIAS CUT 4 980044 (GAUZE/BANDAGES/DRESSINGS) ×2 IMPLANT
STOCKINETTE STRL 4IN 9604848 (GAUZE/BANDAGES/DRESSINGS) ×2 IMPLANT
STRIP CLOSURE SKIN 1/2X4 (GAUZE/BANDAGES/DRESSINGS) ×2 IMPLANT
SUT ETHILON 5-0 FS-2 18 BLK (SUTURE) ×2 IMPLANT
SUT VIC AB 2-0 SH 27 (SUTURE) ×2
SUT VIC AB 2-0 SH 27XBRD (SUTURE) ×1 IMPLANT
SUT VIC AB 3-0 PS2 18 (SUTURE) ×2 IMPLANT

## 2017-11-28 NOTE — Discharge Instructions (Signed)
AMBULATORY SURGERY  °DISCHARGE INSTRUCTIONS ° ° °1) The drugs that you were given will stay in your system until tomorrow so for the next 24 hours you should not: ° °A) Drive an automobile °B) Make any legal decisions °C) Drink any alcoholic beverage ° ° °2) You may resume regular meals tomorrow.  Today it is better to start with liquids and gradually work up to solid foods. ° °You may eat anything you prefer, but it is better to start with liquids, then soup and crackers, and gradually work up to solid foods. ° ° °3) Please notify your doctor immediately if you have any unusual bleeding, trouble breathing, redness and pain at the surgery site, drainage, fever, or pain not relieved by medication. °4)  ° °5) Your post-operative visit with Dr.                     °           °     is: Date:                        Time:   ° °Please call to schedule your post-operative visit. ° °6) Additional Instructions: ° ° ° ° ° °Instructions after Hand / Wrist Surgery ° ° James P. Hooten, Jr., M.D. ° Dept. of Orthopaedics & Sports Medicine ° [facility] ° [address] ° [place], [place]  [address] ° ° Phone: [phone]   Fax: [phone] ° ° °DIET: °• Drink plenty of non-alcoholic fluids & begin a light diet. °• Resume your normal diet the day after surgery. ° °ACTIVITY:  °• Keep the hand elevated above the level of the elbow. °• Begin gently moving the fingers on a regular basis to avoid stiffness. °• Avoid any heavy lifting, pushing, or pulling with the operative hand. °• Do not drive or operate any equipment until instructed. ° °WOUND CARE:  °• Keep the splint/bandage clean and dry.  °• The splint and stitches will be removed in the office. °• Continue to use the ice packs periodically to reduce pain and swelling. °• You may bathe or shower after the stitches are removed at the first office visit following surgery. ° °MEDICATIONS: °• You may resume your regular medications. °• Please take the pain medication as  prescribed. °• Do not take pain medication on an empty stomach. °• Do not drive or drink alcoholic beverages when taking pain medications. ° °CALL THE OFFICE FOR: °• Temperature above 101 degrees °• Excessive bleeding or drainage on the dressing. °• Excessive swelling, coldness, or paleness of the fingers. °• Persistent nausea and vomiting. ° °FOLLOW-UP:  °• You should have an appointment to return to the office in 7-10 days after surgery.  ° °REMEMBER: R.I.C.E. = Rest, Ice, Compression, Elevation !  °

## 2017-11-28 NOTE — H&P (Signed)
The patient has been re-examined, and the chart reviewed, and there have been no interval changes to the documented history and physical.    The risks, benefits, and alternatives have been discussed at length. The patient expressed understanding of the risks benefits and agreed with plans for surgical intervention.  James P. Hooten, Jr. M.D.    

## 2017-11-28 NOTE — Anesthesia Preprocedure Evaluation (Signed)
Anesthesia Evaluation  Patient identified by MRN, date of birth, ID band Patient awake    Reviewed: Allergy & Precautions, NPO status , Patient's Chart, lab work & pertinent test results  History of Anesthesia Complications Negative for: history of anesthetic complications  Airway Mallampati: II       Dental   Pulmonary neg sleep apnea, neg COPD,           Cardiovascular (-) hypertension(-) Past MI and (-) CHF (-) dysrhythmias (-) Valvular Problems/Murmurs     Neuro/Psych neg Seizures Anxiety Depression    GI/Hepatic Neg liver ROS, neg GERD  ,  Endo/Other  neg diabetes  Renal/GU negative Renal ROS     Musculoskeletal   Abdominal   Peds  Hematology   Anesthesia Other Findings   Reproductive/Obstetrics                             Anesthesia Physical Anesthesia Plan  ASA: I  Anesthesia Plan: General   Post-op Pain Management:    Induction: Intravenous  PONV Risk Score and Plan: 2 and Dexamethasone and Ondansetron  Airway Management Planned: LMA  Additional Equipment:   Intra-op Plan:   Post-operative Plan:   Informed Consent: I have reviewed the patients History and Physical, chart, labs and discussed the procedure including the risks, benefits and alternatives for the proposed anesthesia with the patient or authorized representative who has indicated his/her understanding and acceptance.     Plan Discussed with:   Anesthesia Plan Comments:         Anesthesia Quick Evaluation

## 2017-11-28 NOTE — Transfer of Care (Signed)
Immediate Anesthesia Transfer of Care Note  Patient: Roger Wagner  Procedure(s) Performed: CARPAL TUNNEL RELEASE (Left ) REMOVAL GANGLION OF DORSAL WRIST (Left )  Patient Location: PACU  Anesthesia Type:General  Level of Consciousness: awake and responds to stimulation  Airway & Oxygen Therapy: Patient Spontanous Breathing and Patient connected to face mask oxygen  Post-op Assessment: Report given to RN and Post -op Vital signs reviewed and stable  Post vital signs: Reviewed and stable  Last Vitals:  Vitals:   11/28/17 1358 11/28/17 1710  BP: (!) 145/86 130/84  Pulse: 70 73  Resp: 16 10  Temp: (!) 36.1 C   SpO2: 98% 98%    Last Pain:  Vitals:   11/28/17 1358  TempSrc: Temporal         Complications: No apparent anesthesia complications

## 2017-11-28 NOTE — Op Note (Signed)
OPERATIVE NOTE  DATE OF SURGERY:  11/28/2017  PATIENT NAME:  Roger Wagner   DOB: [birth year]  MRN: [id number]  PRE-OPERATIVE DIAGNOSIS: Left carpal tunnel syndrome Ganglion cyst on the dorsal left wrist  POST-OPERATIVE DIAGNOSIS:  Same  PROCEDURE:  Left carpal tunnel release Excision of ganglion cyst from the dorsal left wrist  SURGEON:  Marciano Sequin. M.D.  ANESTHESIA: general  ESTIMATED BLOOD LOSS: Minimal  FLUIDS REPLACED: 300 mL of crystalloid  TOURNIQUET TIME: 55 minutes  DRAINS: None  INDICATIONS FOR SURGERY: Roger Wagner is a 57 y.o. year old male with a long history of numbness and paresthesias to the left hand. EMG/nerve conduction studies demonstrated findings consistent with carpal tunnel syndrome.he also had a cystic swelling to the dorsum of the left wrist consistent with a ganglion cyst.  The patient had not seen any significant improvement despite conservative nonsurgical intervention. After discussion of the risks and benefits of surgical intervention, the patient expressed understanding of the risks benefits and agree with plans for carpal tunnel release and excision of the ganglion cyst.   PROCEDURE IN DETAIL: The patient was brought into the operating room and after adequate general anesthesia, a tourniquet was placed on the patient's left upper arm.The left hand and arm were prepped with alcohol and Duraprep and draped in the usual sterile fashion. A "time-out" was performed as per usual protocol. The hand and forearm were exsanguinated using an Esmarch and the tourniquet was inflated to 250 mmHg. Loupe magnification was used throughout the procedure. An incision was made just ulnar to the thenar palmar crease. Dissection was carried down through the palmar fascia to the transverse carpal ligament. The transverse carpal ligament was sharply incised, taking care to protect the underlying structures with the carpal tunnel. Complete release of the transverse  carpal ligament was achieved. There was no evidence of ganglion cyst or lipoma within the carpal tunnel. The wound was irrigated with copious amounts of normal saline with antibiotic solution. The skin was then re-approximated with interrupted sutures of #5-0 nylon.  Next, attention was directed to the dorsum of the wrist.  A longitudinal incision was made directly over the area of the ganglion cyst.  Dissection was carried out around the periphery of the cyst using tenotomy scissors.  The cyst was then excised and submitted for pathology.  There appeared to be communication between the cyst and the carpus.  The stalk was then ligated and reinforced using #3-0 Vicryl.  The wound was irrigated with normal saline.  Skin edges were reapproximated using interrupted sutures of #5-0 nylon.  A sterile dressing was applied followed by application of a volar splint. The tourniquet was deflated with a total tourniquet time of 55 minutes.  The patient tolerated the procedure well and was transported to the PACU in stable condition.  Roger Wagner., M.D.

## 2017-11-28 NOTE — Anesthesia Post-op Follow-up Note (Signed)
Anesthesia QCDR form completed.        

## 2017-11-28 NOTE — Anesthesia Procedure Notes (Signed)
Procedure Name: LMA Insertion Performed by: Lance Muss, CRNA Pre-anesthesia Checklist: Patient identified, Patient being monitored, Timeout performed, Emergency Drugs available and Suction available Patient Re-evaluated:Patient Re-evaluated prior to induction Oxygen Delivery Method: Circle system utilized Preoxygenation: Pre-oxygenation with 100% oxygen Induction Type: IV induction Ventilation: Mask ventilation without difficulty LMA: LMA inserted LMA Size: 3.0 Tube type: Oral Number of attempts: 2 Placement Confirmation: positive ETCO2 and breath sounds checked- equal and bilateral Tube secured with: Tape Dental Injury: Teeth and Oropharynx as per pre-operative assessment  Comments: Attempted first 4.5 LMA, unable to seat. Removed and placed 3.0 LMA. +BBS, +ETCO2.

## 2017-11-29 ENCOUNTER — Encounter: Payer: Self-pay | Admitting: Orthopedic Surgery

## 2017-11-29 NOTE — Anesthesia Postprocedure Evaluation (Signed)
Anesthesia Post Note  Patient: Roger Wagner  Procedure(s) Performed: CARPAL TUNNEL RELEASE (Left ) REMOVAL GANGLION OF DORSAL WRIST (Left )  Patient location during evaluation: PACU Anesthesia Type: General Level of consciousness: awake and alert and oriented Pain management: pain level controlled Vital Signs Assessment: post-procedure vital signs reviewed and stable Respiratory status: spontaneous breathing Cardiovascular status: blood pressure returned to baseline Anesthetic complications: no     Last Vitals:  Vitals:   11/28/17 1759 11/28/17 1844  BP: 122/76 132/60  Pulse: (!) 57   Resp: 14   Temp: 36.6 C   SpO2: 100%     Last Pain:  Vitals:   11/28/17 1759  TempSrc:   PainSc: 3                  Tiger Spieker

## 2017-11-30 LAB — SURGICAL PATHOLOGY

## 2018-03-19 ENCOUNTER — Other Ambulatory Visit: Payer: Self-pay

## 2018-03-19 ENCOUNTER — Ambulatory Visit (AMBULATORY_SURGERY_CENTER): Payer: Self-pay | Admitting: *Deleted

## 2018-03-19 VITALS — Ht 72.0 in | Wt 201.6 lb

## 2018-03-19 DIAGNOSIS — Z86010 Personal history of colonic polyps: Secondary | ICD-10-CM

## 2018-03-19 MED ORDER — NA SULFATE-K SULFATE-MG SULF 17.5-3.13-1.6 GM/177ML PO SOLN: 1.0000 | Freq: Once | ORAL | 0 refills | Status: AC

## 2018-03-19 NOTE — Progress Notes (Signed)
No egg or soy allergy known to patient  No issues with past sedation with any surgeries  or procedures, no intubation problems - hard to wake post op wrist surgery  No diet pills per patient No home 02 use per patient  No blood thinners per patient  Pt denies issues with constipation  No A fib or A flutter  EMMI video sent to pt's e mail - pt declined  $15 Siprep coupon to pt today in [place]

## 2018-03-21 ENCOUNTER — Encounter: Payer: Self-pay | Admitting: Internal Medicine

## 2018-03-29 ENCOUNTER — Other Ambulatory Visit: Payer: Self-pay

## 2018-03-29 ENCOUNTER — Encounter: Payer: Self-pay | Admitting: Internal Medicine

## 2018-03-29 ENCOUNTER — Ambulatory Visit (AMBULATORY_SURGERY_CENTER): Payer: BLUE CROSS/BLUE SHIELD | Admitting: Internal Medicine

## 2018-03-29 VITALS — BP 112/72 | HR 58 | Temp 99.1°F | Resp 14 | Ht 72.0 in | Wt 198.0 lb

## 2018-03-29 DIAGNOSIS — D128 Benign neoplasm of rectum: Secondary | ICD-10-CM

## 2018-03-29 DIAGNOSIS — D123 Benign neoplasm of transverse colon: Secondary | ICD-10-CM

## 2018-03-29 DIAGNOSIS — K621 Rectal polyp: Secondary | ICD-10-CM

## 2018-03-29 DIAGNOSIS — Z86010 Personal history of colonic polyps: Secondary | ICD-10-CM

## 2018-03-29 LAB — HM COLONOSCOPY

## 2018-03-29 MED ORDER — SODIUM CHLORIDE 0.9 % IV SOLN: 500.0000 mL | Freq: Once | INTRAVENOUS | Status: DC

## 2018-03-29 NOTE — Progress Notes (Signed)
Pt's states no medical or surgical changes since previsit or office visit. 

## 2018-03-29 NOTE — Progress Notes (Signed)
Called to room to assist during endoscopic procedure.  Patient ID and intended procedure confirmed with present staff. Received instructions for my participation in the procedure from the performing physician.  

## 2018-03-29 NOTE — Op Note (Signed)
[name] Procedure Date: 03/29/2018 3:22 PM MRN: [id number] Endoscopist: Jerene Bears , MD Age: 57 Referring MD:  Date of Birth: 01/23/1961 Gender: Male Account #: [id number] Procedure:                Colonoscopy Indications:              High risk colon cancer surveillance: Personal                            history of adenoma (10 mm or greater in size), Last                            colonoscopy: December 2015 Medicines:                Monitored Anesthesia Care Procedure:                Pre-Anesthesia Assessment:                           - Prior to the procedure, a History and Physical                            was performed, and patient medications and                            allergies were reviewed. The patient's tolerance of                            previous anesthesia was also reviewed. The risks                            and benefits of the procedure and the sedation                            options and risks were discussed with the patient.                            All questions were answered, and informed consent                            was obtained. Prior Anticoagulants: The patient has                            taken no previous anticoagulant or antiplatelet                            agents. ASA Grade Assessment: II - A patient with                            mild systemic disease. After reviewing the risks                            and benefits, the patient was deemed in  satisfactory condition to undergo the procedure.                           After obtaining informed consent, the colonoscope                            was passed under direct vision. Throughout the                            procedure, the patient's blood pressure, pulse, and                            oxygen saturations were monitored continuously. The                            Colonoscope was introduced through the  anus and                            advanced to the cecum, identified by appendiceal                            orifice and ileocecal valve. The colonoscopy was                            performed without difficulty. The patient tolerated                            the procedure well. The quality of the bowel                            preparation was good. The ileocecal valve,                            appendiceal orifice, and rectum were photographed. Scope In: 3:28:16 PM Scope Out: 3:40:29 PM Scope Withdrawal Time: 0 hours 9 minutes 2 seconds  Total Procedure Duration: 0 hours 12 minutes 13 seconds  Findings:                 The digital rectal exam was normal.                           A 5 mm polyp was found in the distal transverse                            colon. The polyp was sessile. The polyp was removed                            with a cold snare. Resection and retrieval were                            complete.                           A 4 mm polyp was found in the rectum. The polyp was  sessile. The polyp was removed with a cold snare.                            Resection and retrieval were complete.                           A tattoo was seen in the sigmoid colon. The tattoo                            site appeared normal with no evidence of residual                            polyp tissue.                           Internal hemorrhoids were found during                            retroflexion. The hemorrhoids were small. Complications:            No immediate complications. Estimated Blood Loss:     Estimated blood loss was minimal. Impression:               - One 5 mm polyp in the distal transverse colon,                            removed with a cold snare. Resected and retrieved.                           - One 4 mm polyp in the rectum, removed with a cold                            snare. Resected and retrieved.                           -  A tattoo was seen in the sigmoid colon. The                            tattoo site appeared normal.                           - Internal hemorrhoids. Recommendation:           - Patient has a contact number available for                            emergencies. The signs and symptoms of potential                            delayed complications were discussed with the                            patient. Return to normal activities tomorrow.  Written discharge instructions were provided to the                            patient.                           - Resume previous diet.                           - Continue present medications.                           - Await pathology results.                           - Repeat colonoscopy in 5 years for surveillance. Jerene Bears, MD 03/29/2018 3:44:21 PM This report has been signed electronically.

## 2018-03-29 NOTE — Progress Notes (Signed)
To recovery, report to RN, VSS. 

## 2018-03-29 NOTE — Patient Instructions (Signed)
Please read handouts on polyps ans hemorrhoids.      YOU HAD AN ENDOSCOPIC PROCEDURE TODAY AT [facility]:   Refer to the procedure report that was given to you for any specific questions about what was found during the examination.  If the procedure report does not answer your questions, please call your gastroenterologist to clarify.  If you requested that your care partner not be given the details of your procedure findings, then the procedure report has been included in a sealed envelope for you to review at your convenience later.  YOU SHOULD EXPECT: Some feelings of bloating in the abdomen. Passage of more gas than usual.  Walking can help get rid of the air that was put into your GI tract during the procedure and reduce the bloating. If you had a lower endoscopy (such as a colonoscopy or flexible sigmoidoscopy) you may notice spotting of blood in your stool or on the toilet paper. If you underwent a bowel prep for your procedure, you may not have a normal bowel movement for a few days.  Please Note:  You might notice some irritation and congestion in your nose or some drainage.  This is from the oxygen used during your procedure.  There is no need for concern and it should clear up in a day or so.  SYMPTOMS TO REPORT IMMEDIATELY:   Following lower endoscopy (colonoscopy or flexible sigmoidoscopy):  Excessive amounts of blood in the stool  Significant tenderness or worsening of abdominal pains  Swelling of the abdomen that is new, acute  Fever of 100F or higher   For urgent or emergent issues, a gastroenterologist can be reached at any hour by calling [phone].   DIET:  We do recommend a small meal at first, but then you may proceed to your regular diet.  Drink plenty of fluids but you should avoid alcoholic beverages for 24 hours.  ACTIVITY:  You should plan to take it easy for the rest of today and you should NOT DRIVE or use heavy machinery until tomorrow  (because of the sedation medicines used during the test).    FOLLOW UP: Our staff will call the number listed on your records the next business day following your procedure to check on you and address any questions or concerns that you may have regarding the information given to you following your procedure. If we do not reach you, we will leave a message.  However, if you are feeling well and you are not experiencing any problems, there is no need to return our call.  We will assume that you have returned to your regular daily activities without incident.  If any biopsies were taken you will be contacted by phone or by letter within the next 1-3 weeks.  Please call us at [phone] if you have not heard about the biopsies in 3 weeks.    SIGNATURES/CONFIDENTIALITY: You and/or your care partner have signed paperwork which will be entered into your electronic medical record.  These signatures attest to the fact that that the information above on your After Visit Summary has been reviewed and is understood.  Full responsibility of the confidentiality of this discharge information lies with you and/or your care-partner.

## 2018-03-30 LAB — HM COLONOSCOPY

## 2018-04-01 ENCOUNTER — Telehealth: Payer: Self-pay | Admitting: *Deleted

## 2018-04-01 NOTE — Telephone Encounter (Signed)
  Follow up Call-  Call back number 03/29/2018  Post procedure Call Back phone  # [phone]  Permission to leave phone message Yes  Some recent data might be hidden     Patient questions:  Do you have a fever, pain , or abdominal swelling? No. Pain Score  0 *  Have you tolerated food without any problems? Yes.    Have you been able to return to your normal activities? Yes.    Do you have any questions about your discharge instructions: Diet   No. Medications  No. Follow up visit  No.  Do you have questions or concerns about your Care? No.  Actions: * If pain score is 4 or above: No action needed, pain <4.

## 2018-04-08 ENCOUNTER — Encounter: Payer: Self-pay | Admitting: Internal Medicine

## 2018-04-26 DIAGNOSIS — S81801A Unspecified open wound, right lower leg, initial encounter: Secondary | ICD-10-CM | POA: Diagnosis not present

## 2018-07-25 DIAGNOSIS — M1711 Unilateral primary osteoarthritis, right knee: Secondary | ICD-10-CM | POA: Diagnosis not present

## 2018-07-25 DIAGNOSIS — G8929 Other chronic pain: Secondary | ICD-10-CM | POA: Diagnosis not present

## 2018-07-25 DIAGNOSIS — M25561 Pain in right knee: Secondary | ICD-10-CM | POA: Diagnosis not present

## 2018-11-01 DIAGNOSIS — H2513 Age-related nuclear cataract, bilateral: Secondary | ICD-10-CM | POA: Diagnosis not present

## 2018-11-01 DIAGNOSIS — H5203 Hypermetropia, bilateral: Secondary | ICD-10-CM | POA: Diagnosis not present

## 2019-09-29 ENCOUNTER — Encounter: Payer: Self-pay | Admitting: Internal Medicine

## 2019-09-29 ENCOUNTER — Other Ambulatory Visit: Payer: Self-pay

## 2019-09-29 ENCOUNTER — Other Ambulatory Visit (INDEPENDENT_AMBULATORY_CARE_PROVIDER_SITE_OTHER): Payer: BC Managed Care – PPO

## 2019-09-29 ENCOUNTER — Ambulatory Visit (INDEPENDENT_AMBULATORY_CARE_PROVIDER_SITE_OTHER): Payer: BC Managed Care – PPO | Admitting: Internal Medicine

## 2019-09-29 VITALS — BP 120/80 | HR 74 | Temp 98.1°F | Resp 16 | Ht 72.0 in | Wt 209.0 lb

## 2019-09-29 DIAGNOSIS — Z0000 Encounter for general adult medical examination without abnormal findings: Secondary | ICD-10-CM

## 2019-09-29 DIAGNOSIS — R739 Hyperglycemia, unspecified: Secondary | ICD-10-CM | POA: Insufficient documentation

## 2019-09-29 LAB — BASIC METABOLIC PANEL
BUN: 19 mg/dL (ref 6–23)
CO2: 28 mEq/L (ref 19–32)
Calcium: 9.4 mg/dL (ref 8.4–10.5)
Chloride: 103 mEq/L (ref 96–112)
Creatinine, Ser: 0.98 mg/dL (ref 0.40–1.50)
GFR: 78.21 mL/min (ref >60.00)
Glucose, Bld: 109 mg/dL — ABNORMAL HIGH (ref 70–99)
Potassium: 4 mEq/L (ref 3.5–5.1)
Sodium: 140 mEq/L (ref 135–145)

## 2019-09-29 LAB — PSA: PSA: 1.87 ng/mL (ref 0.10–4.00)

## 2019-09-29 LAB — LIPID PANEL
Cholesterol: 197 mg/dL (ref 0–200)
HDL: 49.8 mg/dL (ref >39.00)
LDL Cholesterol: 109 mg/dL — ABNORMAL HIGH (ref 0–99)
NonHDL: 147.34
Total CHOL/HDL Ratio: 4
Triglycerides: 194 mg/dL — ABNORMAL HIGH (ref 0.0–149.0)
VLDL: 38.8 mg/dL (ref 0.0–40.0)

## 2019-09-29 LAB — HEMOGLOBIN A1C: Hgb A1c MFr Bld: 5.4 % (ref 4.6–6.5)

## 2019-09-29 NOTE — Patient Instructions (Signed)

## 2019-09-29 NOTE — Progress Notes (Signed)
Subjective:  Patient ID: Roger Wagner, male    DOB: 05-23-1961  Age: 58 y.o. MRN: [id number]  CC: Annual Exam  This visit occurred during the SARS-CoV-2 public health emergency.  Safety protocols were in place, including screening questions prior to the visit, additional usage of staff PPE, and extensive cleaning of exam room while observing appropriate contact time as indicated for disinfecting solutions.    HPI Roger Wagner presents for a CPX.  Outpatient Medications Prior to Visit  Medication Sig Dispense Refill  . acetaminophen (TYLENOL) 650 MG CR tablet Take 650 mg by mouth every 8 (eight) hours as needed for pain.    . Multiple Vitamin (MULTIVITAMIN WITH MINERALS) TABS tablet Take 1 tablet by mouth daily.    Marland Kitchen HYDROcodone-acetaminophen (NORCO) 5-325 MG tablet Take 1-2 tablets by mouth every 4 (four) hours as needed for moderate pain. (Patient not taking: Reported on 09/29/2019) 30 tablet 0   Facility-Administered Medications Prior to Visit  Medication Dose Route Frequency Provider Last Rate Last Admin  . 0.9 %  sodium chloride infusion  500 mL Intravenous Once Pyrtle, Lajuan Lines, MD        ROS Review of Systems  Constitutional: Negative for diaphoresis and fatigue.  HENT: Negative.   Eyes: Negative for visual disturbance.  Respiratory: Negative for cough, chest tightness, shortness of breath and wheezing.   Cardiovascular: Negative for chest pain, palpitations and leg swelling.  Gastrointestinal: Negative for abdominal pain, constipation, diarrhea, nausea and vomiting.  Endocrine: Negative.   Genitourinary: Negative.  Negative for difficulty urinating, scrotal swelling and testicular pain.  Musculoskeletal: Negative for arthralgias.  Skin: Negative.  Negative for color change.  Neurological: Negative.  Negative for dizziness, weakness, light-headedness and headaches.  Hematological: Negative for adenopathy. Does not bruise/bleed easily.  Psychiatric/Behavioral: Negative.      Objective:  BP 120/80 (BP Location: Left Arm, Patient Position: Sitting, Cuff Size: Large)   Pulse 74   Temp 98.1 F (36.7 C) (Oral)   Resp 16   Ht 6' (1.829 m)   Wt 209 lb (94.8 kg)   SpO2 98%   BMI 28.35 kg/m   BP Readings from Last 3 Encounters:  09/29/19 120/80  03/29/18 112/72  11/28/17 132/60    Wt Readings from Last 3 Encounters:  09/29/19 209 lb (94.8 kg)  03/29/18 198 lb (89.8 kg)  03/19/18 201 lb 9.6 oz (91.4 kg)    Physical Exam Vitals reviewed.  Constitutional:      Appearance: Normal appearance.  HENT:     Nose: Nose normal.     Mouth/Throat:     Mouth: Mucous membranes are moist.  Eyes:     General: No scleral icterus.    Conjunctiva/sclera: Conjunctivae normal.  Cardiovascular:     Rate and Rhythm: Normal rate and regular rhythm.     Heart sounds: No murmur.  Pulmonary:     Effort: Pulmonary effort is normal.     Breath sounds: No stridor. No wheezing, rhonchi or rales.  Abdominal:     General: Abdomen is flat. Bowel sounds are normal. There is no distension.     Palpations: There is no hepatomegaly or splenomegaly.     Tenderness: There is no abdominal tenderness.     Hernia: There is no hernia in the left inguinal area or right inguinal area.  Genitourinary:    Pubic Area: No rash.      Penis: Normal and circumcised. No discharge, swelling or lesions.      Testes:  Normal.        Right: Mass not present.        Left: Mass not present.     Epididymis:     Right: Normal. Not inflamed or enlarged.     Left: Normal. Not inflamed or enlarged.     Prostate: Enlarged (1+ smooth symm BPH). Not tender and no nodules present.     Rectum: Normal. Guaiac result negative. No mass, tenderness, anal fissure, external hemorrhoid or internal hemorrhoid. Normal anal tone.  Musculoskeletal:        General: Normal range of motion.     Cervical back: Neck supple.  Lymphadenopathy:     Cervical: No cervical adenopathy.     Lower Body: No right inguinal  adenopathy. No left inguinal adenopathy.  Skin:    General: Skin is warm and dry.     Coloration: Skin is not pale.  Neurological:     General: No focal deficit present.     Mental Status: He is alert.  Psychiatric:        Mood and Affect: Mood normal.        Behavior: Behavior normal.     Lab Results  Component Value Date   WBC 4.7 01/24/2016   HGB 13.7 01/24/2016   HCT 39.6 01/24/2016   PLT 295.0 01/24/2016   GLUCOSE 109 (H) 09/29/2019   CHOL 197 09/29/2019   TRIG 194.0 (H) 09/29/2019   HDL 49.80 09/29/2019   LDLCALC 109 (H) 09/29/2019   ALT 21 08/07/2017   AST 20 08/07/2017   NA 140 09/29/2019   K 4.0 09/29/2019   CL 103 09/29/2019   CREATININE 0.98 09/29/2019   BUN 19 09/29/2019   CO2 28 09/29/2019   TSH 2.08 01/24/2016   PSA 1.87 09/29/2019   HGBA1C 5.4 09/29/2019    No results found.  Assessment & Plan:   Roger Wagner was seen today for annual exam.  Diagnoses and all orders for this visit:  Routine general medical examination at a health care facility- Exam completed, labs reviewed- He does not have an elevated ASCVD risk score so I did not recommend a statin for CV risk reduction, he refused a flu vaccine, colon cancer screening is up-to-date, patient education was given. -     Lipid panel; Future -     PSA; Future -     HIV antibody (Reflex); Future  Hyperglycemia- He has mild hyperglycemia but he is not prediabetic. -     Basic metabolic panel; Future -     Hemoglobin A1c; Future   I have discontinued Trinna Balloon. Carone's HYDROcodone-acetaminophen. I am also having him maintain his multivitamin with minerals and acetaminophen. We will continue to administer sodium chloride.  No orders of the defined types were placed in this encounter.    Follow-up: Return in about 1 year (around 09/28/2020).  Scarlette Calico, MD

## 2019-09-30 ENCOUNTER — Encounter: Payer: Self-pay | Admitting: Internal Medicine

## 2019-09-30 LAB — HIV ANTIBODY (ROUTINE TESTING W REFLEX): HIV 1&2 Ab, 4th Generation: NONREACTIVE

## 2021-05-25 ENCOUNTER — Other Ambulatory Visit: Payer: Self-pay

## 2021-05-25 ENCOUNTER — Encounter: Payer: Self-pay | Admitting: Internal Medicine

## 2021-05-25 ENCOUNTER — Ambulatory Visit (INDEPENDENT_AMBULATORY_CARE_PROVIDER_SITE_OTHER): Payer: BC Managed Care – PPO | Admitting: Internal Medicine

## 2021-05-25 VITALS — BP 126/84 | HR 75 | Temp 98.1°F | Ht 72.0 in | Wt 210.0 lb

## 2021-05-25 DIAGNOSIS — Z0000 Encounter for general adult medical examination without abnormal findings: Secondary | ICD-10-CM | POA: Diagnosis not present

## 2021-05-25 DIAGNOSIS — Z23 Encounter for immunization: Secondary | ICD-10-CM

## 2021-05-25 DIAGNOSIS — R739 Hyperglycemia, unspecified: Secondary | ICD-10-CM

## 2021-05-25 LAB — LIPID PANEL
Cholesterol: 199 mg/dL (ref 0–200)
HDL: 48.1 mg/dL (ref >39.00)
NonHDL: 150.84
Total CHOL/HDL Ratio: 4
Triglycerides: 250 mg/dL — ABNORMAL HIGH (ref 0.0–149.0)
VLDL: 50 mg/dL — ABNORMAL HIGH (ref 0.0–40.0)

## 2021-05-25 LAB — BASIC METABOLIC PANEL
BUN: 24 mg/dL — ABNORMAL HIGH (ref 6–23)
CO2: 25 mEq/L (ref 19–32)
Calcium: 9.5 mg/dL (ref 8.4–10.5)
Chloride: 102 mEq/L (ref 96–112)
Creatinine, Ser: 1 mg/dL (ref 0.40–1.50)
GFR: 81.57 mL/min (ref >60.00)
Glucose, Bld: 99 mg/dL (ref 70–99)
Potassium: 3.9 mEq/L (ref 3.5–5.1)
Sodium: 138 mEq/L (ref 135–145)

## 2021-05-25 LAB — HEMOGLOBIN A1C: Hgb A1c MFr Bld: 5.6 % (ref 4.6–6.5)

## 2021-05-25 LAB — PSA: PSA: 1.94 ng/mL (ref 0.10–4.00)

## 2021-05-25 LAB — LDL CHOLESTEROL, DIRECT: Direct LDL: 120 mg/dL

## 2021-05-25 NOTE — Patient Instructions (Signed)
Health Maintenance, Male Adopting a healthy lifestyle and getting preventive care are important in promoting health and wellness. Ask your health care provider about: The right schedule for you to have regular tests and exams. Things you can do on your own to prevent diseases and keep yourself healthy. What should I know about diet, weight, and exercise? Eat a healthy diet  Eat a diet that includes plenty of vegetables, fruits, low-fat dairy products, and lean protein. Do not eat a lot of foods that are high in solid fats, added sugars, or sodium.  Maintain a healthy weight Body mass index (BMI) is a measurement that can be used to identify possible weight problems. It estimates body fat based on height and weight. Your health care provider can help determine your BMI and help you achieve or maintain ahealthy weight. Get regular exercise Get regular exercise. This is one of the most important things you can do for your health. Most adults should: Exercise for at least 150 minutes each week. The exercise should increase your heart rate and make you sweat (moderate-intensity exercise). Do strengthening exercises at least twice a week. This is in addition to the moderate-intensity exercise. Spend less time sitting. Even light physical activity can be beneficial. Watch cholesterol and blood lipids Have your blood tested for lipids and cholesterol at 61 years of age, then havethis test every 5 years. You may need to have your cholesterol levels checked more often if: Your lipid or cholesterol levels are high. You are older than 61 years of age. You are at high risk for heart disease. What should I know about cancer screening? Many types of cancers can be detected early and may often be prevented. Depending on your health history and family history, you may need to have cancer screening at various ages. This may include screening for: Colorectal cancer. Prostate cancer. Skin cancer. Lung  cancer. What should I know about heart disease, diabetes, and high blood pressure? Blood pressure and heart disease High blood pressure causes heart disease and increases the risk of stroke. This is more likely to develop in people who have high blood pressure readings, are of African descent, or are overweight. Talk with your health care provider about your target blood pressure readings. Have your blood pressure checked: Every 3-5 years if you are 18-39 years of age. Every year if you are 40 years old or older. If you are between the ages of 65 and 75 and are a current or former smoker, ask your health care provider if you should have a one-time screening for abdominal aortic aneurysm (AAA). Diabetes Have regular diabetes screenings. This checks your fasting blood sugar level. Have the screening done: Once every three years after age 45 if you are at a normal weight and have a low risk for diabetes. More often and at a younger age if you are overweight or have a high risk for diabetes. What should I know about preventing infection? Hepatitis B If you have a higher risk for hepatitis B, you should be screened for this virus. Talk with your health care provider to find out if you are at risk forhepatitis B infection. Hepatitis C Blood testing is recommended for: Everyone born from 1945 through 1965. Anyone with known risk factors for hepatitis C. Sexually transmitted infections (STIs) You should be screened each year for STIs, including gonorrhea and chlamydia, if: You are sexually active and are younger than 61 years of age. You are older than 61 years of age   and your health care provider tells you that you are at risk for this type of infection. Your sexual activity has changed since you were last screened, and you are at increased risk for chlamydia or gonorrhea. Ask your health care provider if you are at risk. Ask your health care provider about whether you are at high risk for HIV.  Your health care provider may recommend a prescription medicine to help prevent HIV infection. If you choose to take medicine to prevent HIV, you should first get tested for HIV. You should then be tested every 3 months for as long as you are taking the medicine. Follow these instructions at home: Lifestyle Do not use any products that contain nicotine or tobacco, such as cigarettes, e-cigarettes, and chewing tobacco. If you need help quitting, ask your health care provider. Do not use street drugs. Do not share needles. Ask your health care provider for help if you need support or information about quitting drugs. Alcohol use Do not drink alcohol if your health care provider tells you not to drink. If you drink alcohol: Limit how much you have to 0-2 drinks a day. Be aware of how much alcohol is in your drink. In the [place] one drink equals one 12 oz bottle of beer (355 mL), one 5 oz glass of wine (148 mL), or one 1 oz glass of hard liquor (44 mL). General instructions Schedule regular health, dental, and eye exams. Stay current with your vaccines. Tell your health care provider if: You often feel depressed. You have ever been abused or do not feel safe at home. Summary Adopting a healthy lifestyle and getting preventive care are important in promoting health and wellness. Follow your health care provider's instructions about healthy diet, exercising, and getting tested or screened for diseases. Follow your health care provider's instructions on monitoring your cholesterol and blood pressure. This information is not intended to replace advice given to you by your health care provider. Make sure you discuss any questions you have with your healthcare provider. Document Revised: 09/25/2018 Document Reviewed: 09/25/2018 Elsevier Patient Education  2022 [employer]  

## 2021-05-25 NOTE — Progress Notes (Signed)
Subjective:  Patient ID: Roger Wagner, male    DOB: 12-May-1961  Age: 60 y.o. MRN: [id number]  CC: Annual Exam  This visit occurred during the SARS-CoV-2 public health emergency.  Safety protocols were in place, including screening questions prior to the visit, additional usage of staff PPE, and extensive cleaning of exam room while observing appropriate contact time as indicated for disinfecting solutions.    HPI Roger Wagner presents for a CPX  He is very active and denies any recent episodes of chest pain, shortness of breath, diaphoresis, dizziness, lightheadedness, or chest pain.  Outpatient Medications Prior to Visit  Medication Sig Dispense Refill   Multiple Vitamin (MULTIVITAMIN WITH MINERALS) TABS tablet Take 1 tablet by mouth daily.     acetaminophen (TYLENOL) 650 MG CR tablet Take 650 mg by mouth every 8 (eight) hours as needed for pain.     Facility-Administered Medications Prior to Visit  Medication Dose Route Frequency Provider Last Rate Last Admin   0.9 %  sodium chloride infusion  500 mL Intravenous Once Pyrtle, Lajuan Lines, MD        ROS Review of Systems  Constitutional:  Negative for diaphoresis, fatigue and unexpected weight change.  HENT: Negative.    Eyes: Negative.   Respiratory:  Negative for cough, shortness of breath and wheezing.   Cardiovascular:  Negative for chest pain, palpitations and leg swelling.  Gastrointestinal:  Negative for abdominal pain, constipation, diarrhea, nausea and vomiting.  Endocrine: Negative.   Genitourinary: Negative.  Negative for difficulty urinating.  Musculoskeletal:  Negative for arthralgias and myalgias.  Skin: Negative.  Negative for color change and pallor.  Neurological:  Negative for dizziness, weakness, light-headedness and headaches.  Hematological:  Negative for adenopathy. Does not bruise/bleed easily.  Psychiatric/Behavioral: Negative.     Objective:  BP 126/84 (BP Location: Left Arm, Patient Position:  Sitting, Cuff Size: Large)   Pulse 75   Temp 98.1 F (36.7 C) (Oral)   Ht 6' (1.829 m)   Wt 210 lb (95.3 kg)   SpO2 96%   BMI 28.48 kg/m   BP Readings from Last 3 Encounters:  05/25/21 126/84  09/29/19 120/80  03/29/18 112/72    Wt Readings from Last 3 Encounters:  05/25/21 210 lb (95.3 kg)  09/29/19 209 lb (94.8 kg)  03/29/18 198 lb (89.8 kg)    Physical Exam Vitals reviewed.  HENT:     Nose: Nose normal.     Mouth/Throat:     Mouth: Mucous membranes are moist.  Eyes:     Conjunctiva/sclera: Conjunctivae normal.  Cardiovascular:     Rate and Rhythm: Normal rate and regular rhythm.     Heart sounds: No murmur heard. Pulmonary:     Effort: Pulmonary effort is normal.     Breath sounds: No stridor. No wheezing, rhonchi or rales.  Abdominal:     General: Abdomen is flat. Bowel sounds are normal. There is no distension.     Palpations: Abdomen is soft. There is no hepatomegaly, splenomegaly or mass.     Tenderness: There is no abdominal tenderness. There is no guarding.     Hernia: No hernia is present. There is no hernia in the left inguinal area or right inguinal area.  Genitourinary:    Pubic Area: No rash.      Penis: Normal and circumcised.      Testes: Normal.     Epididymis:     Right: Normal.     Left: Normal.  Prostate: Enlarged. Not tender and no nodules present.     Rectum: Normal. Guaiac result negative. No mass, tenderness, anal fissure, external hemorrhoid or internal hemorrhoid. Normal anal tone.  Musculoskeletal:        General: Normal range of motion.     Cervical back: Neck supple.     Right lower leg: No edema.     Left lower leg: No edema.  Lymphadenopathy:     Cervical: No cervical adenopathy.     Lower Body: No right inguinal adenopathy. No left inguinal adenopathy.  Skin:    General: Skin is warm and dry.  Neurological:     General: No focal deficit present.     Mental Status: He is alert.  Psychiatric:        Behavior: Behavior  normal.    Lab Results  Component Value Date   WBC 4.7 01/24/2016   HGB 13.7 01/24/2016   HCT 39.6 01/24/2016   PLT 295.0 01/24/2016   GLUCOSE 99 05/25/2021   CHOL 199 05/25/2021   TRIG 250.0 (H) 05/25/2021   HDL 48.10 05/25/2021   LDLDIRECT 120.0 05/25/2021   LDLCALC 109 (H) 09/29/2019   ALT 21 08/07/2017   AST 20 08/07/2017   NA 138 05/25/2021   K 3.9 05/25/2021   CL 102 05/25/2021   CREATININE 1.00 05/25/2021   BUN 24 (H) 05/25/2021   CO2 25 05/25/2021   TSH 2.08 01/24/2016   PSA 1.94 05/25/2021   HGBA1C 5.6 05/25/2021    No results found.  Assessment & Plan:   Roger Wagner was seen today for annual exam.  Diagnoses and all orders for this visit:  Hyperglycemia- His A1c is normal. -     Basic metabolic panel; Future -     Hemoglobin A1c; Future -     Hemoglobin A1c -     Basic metabolic panel  Routine general medical examination at a health care facility- Exam completed, labs reviewed, vaccines reviewed and updated, cancer screenings are up-to-date, patient education was given. -     Lipid panel; Future -     PSA; Future -     PSA -     Lipid panel  Other orders -     Varicella-zoster vaccine IM (Shingrix) -     LDL cholesterol, direct  I have discontinued Trinna Balloon. Roger Wagner's acetaminophen. I am also having him maintain his multivitamin with minerals. We will continue to administer sodium chloride.  No orders of the defined types were placed in this encounter.    Follow-up: Return in about 3 months (around 08/25/2021).  Scarlette Calico, MD

## 2021-05-26 ENCOUNTER — Encounter: Payer: Self-pay | Admitting: Internal Medicine

## 2021-07-18 ENCOUNTER — Other Ambulatory Visit: Payer: Self-pay

## 2021-07-18 ENCOUNTER — Ambulatory Visit (INDEPENDENT_AMBULATORY_CARE_PROVIDER_SITE_OTHER): Payer: BC Managed Care – PPO | Admitting: Dermatology

## 2021-07-18 DIAGNOSIS — L821 Other seborrheic keratosis: Secondary | ICD-10-CM

## 2021-07-18 DIAGNOSIS — L820 Inflamed seborrheic keratosis: Secondary | ICD-10-CM

## 2021-07-18 DIAGNOSIS — D1800 Hemangioma unspecified site: Secondary | ICD-10-CM

## 2021-07-18 DIAGNOSIS — L710 Perioral dermatitis: Secondary | ICD-10-CM | POA: Diagnosis not present

## 2021-07-18 DIAGNOSIS — D229 Melanocytic nevi, unspecified: Secondary | ICD-10-CM

## 2021-07-18 DIAGNOSIS — Z1283 Encounter for screening for malignant neoplasm of skin: Secondary | ICD-10-CM

## 2021-07-18 DIAGNOSIS — L719 Rosacea, unspecified: Secondary | ICD-10-CM

## 2021-07-18 DIAGNOSIS — L570 Actinic keratosis: Secondary | ICD-10-CM

## 2021-07-18 DIAGNOSIS — H109 Unspecified conjunctivitis: Secondary | ICD-10-CM

## 2021-07-18 DIAGNOSIS — L814 Other melanin hyperpigmentation: Secondary | ICD-10-CM

## 2021-07-18 DIAGNOSIS — L578 Other skin changes due to chronic exposure to nonionizing radiation: Secondary | ICD-10-CM

## 2021-07-18 MED ORDER — DOXYCYCLINE MONOHYDRATE 100 MG PO CAPS: 100.0000 mg | ORAL_CAPSULE | Freq: Every day | ORAL | 1 refills | Status: AC

## 2021-07-18 NOTE — Progress Notes (Signed)
New Patient Visit  Subjective  Roger Wagner is a 60 y.o. male who presents for the following: New Patient (Initial Visit) (Patient here today as a new patient here today for tbse. He reports no personal or family history of skin cancer. He has concerns about right eye is red and puffy. Breakouts at face and some places on back).  Patient here for full body skin exam and skin cancer screening.  The following portions of the chart were reviewed this encounter and updated as appropriate:   Tobacco  Allergies  Meds  Problems  Med Hx  Surg Hx  Fam Hx     Review of Systems:  No other skin or systemic complaints except as noted in HPI or Assessment and Plan.  Objective  Well appearing patient in no apparent distress; mood and affect are within normal limits.  A full examination was performed including scalp, head, eyes, ears, nose, lips, neck, chest, axillae, abdomen, back, buttocks, bilateral upper extremities, bilateral lower extremities, hands, feet, fingers, toes, fingernails, and toenails. All findings within normal limits unless otherwise noted below.  Head - Anterior (Face) Mid face erythema with telangiectasias +/- scattered inflammatory papules.   right mid back x 1 Erythematous keratotic or waxy stuck-on papule or plaque.   Scalp x 6 (6) Erythematous thin papules/macules with gritty scale.    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 It is treatable but not curable. It sometimes affects the eyes (ocular rosacea) as well. It may respond to topical and/or systemic medication and can flare with stress, sun exposure, alcohol, exercise and some foods.  Daily application of broad spectrum spf 30+ sunscreen to face is recommended to reduce flares.  Start doxycycline 100 mg by mouth daily take with food. If symptoms  improve can d/c   Will prescribe [facility] metronidazole/ivermectin/azelaic acid twice daily as needed to affected areas on the face. The patient was advised this is not covered by insurance since it is made by a compounding pharmacy. They will receive an email to check out and the medication will be mailed to their home.    Doxycycline should be taken with food to prevent nausea. Do not lay down for 30 minutes after taking. Be cautious with sun exposure and use good sun protection while on this medication. Pregnant women should not take this medication.   doxycycline (MONODOX) 100 MG capsule - Head - Anterior (Face) Take 1 capsule (100 mg total) by mouth daily. Take with food  Inflamed seborrheic keratosis right mid back x 1 Destruction of lesion - right mid back x 1 Complexity: simple   Destruction method: cryotherapy   Informed consent: discussed and consent obtained   Timeout:  patient name, date of birth, surgical site, and procedure verified Lesion destroyed using liquid nitrogen: Yes   Region frozen until ice Cre[place]man extended beyond lesion: Yes   Outcome: patient tolerated procedure well with no complications   Post-procedure details: wound care instructions given    Conjunctivitis of right eye, unspecified conjunctivitis type Right Eye Patient woke up with swelling and redness of eye. Recommend follow up with eye doctor or pcp  AK (actinic keratosis) Scalp x 6 Destruction of lesion - Scalp x 6 Complexity: simple   Destruction method: cryotherapy   Informed consent: discussed and consent obtained   Timeout:  patient name, date of  birth, surgical site, and procedure verified Lesion destroyed using liquid nitrogen: Yes   Region frozen until ice Swider extended beyond lesion: Yes   Outcome: patient tolerated procedure well with no complications   Post-procedure details: wound care instructions given    Skin cancer screening  Lentigines - Scattered tan macules - Due to  sun exposure - Benign-appearing, observe - Recommend daily broad spectrum sunscreen SPF 30+ to sun-exposed areas, reapply every 2 hours as needed. - Call for any changes  Seborrheic Keratoses - Stuck-on, waxy, tan-brown papules and/or plaques  - Benign-appearing - Discussed benign etiology and prognosis. - Observe - Call for any changes  Melanocytic Nevi - Tan-brown and/or pink-flesh-colored symmetric macules and papules - Benign appearing on exam today - Observation - Call clinic for new or changing moles - Recommend daily use of broad spectrum spf 30+ sunscreen to sun-exposed areas.   Hemangiomas - Red papules - Discussed benign nature - Observe - Call for any changes  Actinic Damage - Chronic condition, secondary to cumulative UV/sun exposure - diffuse scaly erythematous macules with underlying dyspigmentation - Recommend daily broad spectrum sunscreen SPF 30+ to sun-exposed areas, reapply every 2 hours as needed.  - Staying in the shade or wearing long sleeves, sun glasses (UVA+UVB protection) and wide brim hats (4-inch brim around the entire circumference of the hat) are also recommended for sun protection.  - Call for new or changing lesions.  Skin cancer screening performed today.  Return for 2 - 4 month isk/ak / rosacea follow up, 1 year tbse . IRuthell Rummage, CMA, am acting as scribe for Sarina Ser, MD. Documentation: I have reviewed the above documentation for accuracy and completeness, and I agree with the above.  Sarina Ser, MD

## 2021-07-18 NOTE — Patient Instructions (Signed)
Rosacea  What is rosacea? Rosacea (say: ro-zay-sha) is a common skin disease that usually begins as a trend of flushing or blushing easily.  As rosacea progresses, a persistent redness in the center of the face will develop and may gradually spread beyond the nose and cheeks to the forehead and chin.  In some cases, the ears, chest, and back could be affected.  Rosacea may appear as tiny blood vessels or small red bumps that occur in crops.  Frequently they can contain pus, and are called "pustules".  If the bumps do not contain pus, they are referred to as "papules".  Rarely, in prolonged, untreated cases of rosacea, the oil glands of the nose and cheeks may become permanently enlarged.  This is called rhinophyma, and is seen more frequently in men.  Signs and Risks In its beginning stages, rosacea tends to come and go, which makes it difficult to recognize.  It can start as intermittent flushing of the face.  Eventually, blood vessels may become permanently visible.  Pustules and papules can appear, but can be mistaken for adult acne.  People of all races, ages, genders and ethnic groups are at risk of developing rosacea.  However, it is more common in women (especially around menopause) and adults with fair skin between the ages of 3 and 87.  Treatment Dermatologists typically recommend a combination of treatments to effectively manage rosacea.  Treatment can improve symptoms and may stop the progression of the rosacea.  Treatment may involve both topical and oral medications.  The tetracycline antibiotics are often used for their anti-inflammatory effect; however, because of the possibility of developing antibiotic resistance, they should not be used long term at full dose.  For dilated blood vessels the options include electrodessication (uses electric current through a small needle), laser treatment, and cosmetics to hide the redness.   With all forms of treatment, improvement is a slow process, and  patients may not see any results for the first 3-4 weeks.  It is very important to avoid the sun and other triggers.  Patients must wear sunscreen daily.  Skin Care Instructions: Cleanse the skin with a mild soap such as CeraVe cleanser, Cetaphil cleanser, or Dove soap once or twice daily as needed. Moisturize with Eucerin Redness Relief Daily Perfecting Lotion (has a subtle green tint), CeraVe Moisturizing Cream, or Oil of Olay Daily Moisturizer with sunscreen every morning and/or night as recommended. Makeup should be "non-comedogenic" (won't clog pores) and be labeled "for sensitive skin". Good choices for cosmetics are: Neutrogena, Almay, and Physician's Formula.  Any product with a green tint tends to offset a red complexion. If your eyes are dry and irritated, use artificial tears 2-3 times per day and cleanse the eyelids daily with baby shampoo.  Have your eyes examined at least every 2 years.  Be sure to tell your eye doctor that you have rosacea. Alcoholic beverages tend to cause flushing of the skin, and may make rosacea worse. Always wear sunscreen, protect your skin from extreme hot and cold temperatures, and avoid spicy foods, hot drinks, and mechanical irritation such as rubbing, scrubbing, or massaging the face.  Avoid harsh skin cleansers, cleansing masks, astringents, and exfoliation. If a particular product burns or makes your face feel tight, then it is likely to flare your rosacea. If you are having difficulty finding a sunscreen that you can tolerate, you may try switching to a chemical-free sunscreen.  These are ones whose active ingredient is zinc oxide or titanium dioxide  only.  They should also be fragrance free, non-comedogenic, and labeled for sensitive skin. Rosacea triggers may vary from person to person.  There are a variety of foods that have been reported to trigger rosacea.  Some patients find that keeping a diary of what they were doing when they flared helps them avoid  triggers.  Doxycycline should be taken with food to prevent nausea. Do not lay down for 30 minutes after taking. Be cautious with sun exposure and use good sun protection while on this medication. Pregnant women should not take this medication.    Instructions for [facility] Medications  One or more of your medications was sent to the [facility] mail order compounding pharmacy. You will receive an email from them and can purchase the medicine through that link. It will then be mailed to your home at the address you confirmed. If for any reason you do not receive an email from them, please check your spam folder. If you still do not find the email, please let us know. [facility] phone number is [phone].    Actinic keratoses are precancerous spots that appear secondary to cumulative UV radiation exposure/sun exposure over time. They are chronic with expected duration over 1 year. A portion of actinic keratoses will progress to squamous cell carcinoma of the skin. It is not possible to reliably predict which spots will progress to skin cancer and so treatment is recommended to prevent development of skin cancer.  Recommend daily broad spectrum sunscreen SPF 30+ to sun-exposed areas, reapply every 2 hours as needed.  Recommend staying in the shade or wearing long sleeves, sun glasses (UVA+UVB protection) and wide brim hats (4-inch brim around the entire circumference of the hat). Call for new or changing lesions.   Cryotherapy Aftercare  Wash gently with soap and water everyday.   Apply Vaseline and Band-Aid daily until healed.   Melanoma ABCDEs  Melanoma is the most dangerous type of skin cancer, and is the leading cause of death from skin disease.  You are more likely to develop melanoma if you: Have light-colored skin, light-colored eyes, or red or blond hair Spend a lot of time in the sun Tan regularly, either outdoors or in a tanning bed Have had blistering sunburns,  especially during childhood Have a close family member who has had a melanoma Have atypical moles or large birthmarks  Early detection of melanoma is key since treatment is typically straightforward and cure rates are extremely high if we catch it early.   The first sign of melanoma is often a change in a mole or a new dark spot.  The ABCDE system is a way of remembering the signs of melanoma.  A for asymmetry:  The two halves do not match. B for border:  The edges of the growth are irregular. C for color:  A mixture of colors are present instead of an even brown color. D for diameter:  Melanomas are usually (but not always) greater than 18mm - the size of a pencil eraser. E for evolution:  The spot keeps changing in size, shape, and color.  Please check your skin once per month between visits. You can use a small mirror in front and a large mirror behind you to keep an eye on the back side or your body.   If you see any new or changing lesions before your next follow-up, please call to schedule a visit.  Please continue daily skin protection including broad spectrum sunscreen SPF  30+ to sun-exposed areas, reapplying every 2 hours as needed when you're outdoors.   Staying in the shade or wearing long sleeves, sun glasses (UVA+UVB protection) and wide brim hats (4-inch brim around the entire circumference of the hat) are also recommended for sun protection.    If you have any questions or concerns for your doctor, please call our main line at [phone] and press option 4 to reach your doctor's medical assistant. If no one answers, please leave a voicemail as directed and we will return your call as soon as possible. Messages left after 4 pm will be answered the following business day.   You may also send [place] a message via [place]. We typically respond to MyChart messages within 1-2 business days.  For prescription refills, please ask your pharmacy to contact our office. Our fax number is  [phone].  If you have an urgent issue when the clinic is closed that cannot wait until the next business day, you can page your doctor at the number below.    Please note that while we do our best to be available for urgent issues outside of office hours, we are not available 24/7.   If you have an urgent issue and are unable to reach [place], you may choose to seek medical care at your doctor's office, retail clinic, urgent care center, or emergency room.  If you have a medical emergency, please immediately call 911 or go to the emergency department.  Pager Numbers  - Dr. Nehemiah Massed: [phone]  - Dr. Laurence Ferrari: [phone]  - Dr. Nicole Kindred: [phone]  In the event of inclement weather, please call our main line at [phone] for an update on the status of any delays or closures.  Dermatology Medication Tips: Please keep the boxes that topical medications come in in order to help keep track of the instructions about where and how to use these. Pharmacies typically print the medication instructions only on the boxes and not directly on the medication tubes.   If your medication is too expensive, please contact our office at [phone] option 4 or send [place] a message through [place].   We are unable to tell what your co-pay for medications will be in advance as this is different depending on your insurance coverage. However, we may be able to find a substitute medication at lower cost or fill out paperwork to get insurance to cover a needed medication.   If a prior authorization is required to get your medication covered by your insurance company, please allow [place] 1-2 business days to complete this process.  Drug prices often vary depending on where the prescription is filled and some pharmacies may offer cheaper prices.  The website www.goodrx.com contains coupons for medications through different pharmacies. The prices here do not account for what the cost may be with help from  insurance (it may be cheaper with your insurance), but the website can give you the price if you did not use any insurance.  - You can print the associated coupon and take it with your prescription to the pharmacy.  - You may also stop by our office during regular business hours and pick up a GoodRx coupon card.  - If you need your prescription sent electronically to a different pharmacy, notify our office through [facility] or by phone at [phone] option 4.

## 2021-07-19 ENCOUNTER — Other Ambulatory Visit: Payer: Self-pay | Admitting: Internal Medicine

## 2021-07-19 ENCOUNTER — Encounter: Payer: Self-pay | Admitting: Dermatology

## 2021-07-19 ENCOUNTER — Telehealth: Payer: Self-pay | Admitting: Internal Medicine

## 2021-07-19 MED ORDER — NEOMYCIN-POLYMYXIN-DEXAMETH 3.5-10000-0.1 OP SUSP: 2.0000 [drp] | Freq: Four times a day (QID) | OPHTHALMIC | 0 refills | Status: DC

## 2021-07-19 NOTE — Telephone Encounter (Signed)
Wife called on his behalf stating they have pink eye from their child. Requesting a order for an eye drop be called in for them.   Please advise.

## 2021-11-28 ENCOUNTER — Other Ambulatory Visit: Payer: Self-pay

## 2021-11-28 ENCOUNTER — Ambulatory Visit (INDEPENDENT_AMBULATORY_CARE_PROVIDER_SITE_OTHER): Payer: BC Managed Care – PPO | Admitting: Dermatology

## 2021-11-28 DIAGNOSIS — L570 Actinic keratosis: Secondary | ICD-10-CM | POA: Diagnosis not present

## 2021-11-28 DIAGNOSIS — L578 Other skin changes due to chronic exposure to nonionizing radiation: Secondary | ICD-10-CM

## 2021-11-28 DIAGNOSIS — L719 Rosacea, unspecified: Secondary | ICD-10-CM | POA: Diagnosis not present

## 2021-11-28 NOTE — Progress Notes (Signed)
° °  Follow-Up Visit   Subjective  Roger Wagner is a 61 y.o. male who presents for the following: Actinic Keratosis (4 month follow up - scalp treated with LN2) and Rosacea (4 month follow up - he took doxycycline for about a month but he never got the email about the [facility] cream.). The patient has spots, moles and lesions to be evaluated, some may be new or changing and the patient has concerns that these could be cancer.  The following portions of the chart were reviewed this encounter and updated as appropriate:   Tobacco   Allergies   Meds   Problems   Med Hx   Surg Hx   Fam Hx      Review of Systems:  No other skin or systemic complaints except as noted in HPI or Assessment and Plan.  Objective  Well appearing patient in no apparent distress; mood and affect are within normal limits.  A focused examination was performed including scalp, face. Relevant physical exam findings are noted in the Assessment and Plan.  Face Perioral papules  Scalp Erythematous thin papules/macules with gritty scale.    Assessment & Plan   Actinic Damage - chronic, secondary to cumulative UV radiation exposure/sun exposure over time - diffuse scaly erythematous macules with underlying dyspigmentation - Recommend daily broad spectrum sunscreen SPF 30+ to sun-exposed areas, reapply every 2 hours as needed.  - Recommend staying in the shade or wearing long sleeves, sun glasses (UVA+UVB protection) and wide brim hats (4-inch brim around the entire circumference of the hat). - Call for new or changing lesions.  Rosacea / Folliculitis Face Will prescribe [facility] metronidazole/ivermectin/azelaic acid twice daily as needed to affected areas on the face. The patient was advised this is not covered by insurance since it is made by a compounding pharmacy. They will receive an email to check out and the medication will be mailed to their home.   Oral doxycycline for flares.  AK (actinic  keratosis) Scalp Destruction of lesion - Scalp Complexity: simple   Destruction method: cryotherapy   Informed consent: discussed and consent obtained   Timeout:  patient name, date of birth, surgical site, and procedure verified Lesion destroyed using liquid nitrogen: Yes   Region frozen until ice Reinard extended beyond lesion: Yes   Outcome: patient tolerated procedure well with no complications   Post-procedure details: wound care instructions given    Return in about 6 months (around 05/28/2022) for Follow up.  I, Roger Wagner, CMA, am acting as scribe for Sarina Ser, MD . Documentation: I have reviewed the above documentation for accuracy and completeness, and I agree with the above.  Sarina Ser, MD

## 2021-11-28 NOTE — Patient Instructions (Signed)
Instructions for [facility] Medications  One or more of your medications was sent to the [facility] mail order compounding pharmacy. You will receive an email from them and can purchase the medicine through that link. It will then be mailed to your home at the address you confirmed. If for any reason you do not receive an email from them, please check your spam folder. If you still do not find the email, please let us know. [facility] phone number is [phone].   Cryotherapy Aftercare  Wash gently with soap and water everyday.   Apply Vaseline and Band-Aid daily until healed.    If You Need Anything After Your Visit  If you have any questions or concerns for your doctor, please call our main line at [phone] and press option 4 to reach your doctor's medical assistant. If no one answers, please leave a voicemail as directed and we will return your call as soon as possible. Messages left after 4 pm will be answered the following business day.   You may also send [place] a message via [place]. We typically respond to MyChart messages within 1-2 business days.  For prescription refills, please ask your pharmacy to contact our office. Our fax number is [phone].  If you have an urgent issue when the clinic is closed that cannot wait until the next business day, you can page your doctor at the number below.    Please note that while we do our best to be available for urgent issues outside of office hours, we are not available 24/7.   If you have an urgent issue and are unable to reach [place], you may choose to seek medical care at your doctor's office, retail clinic, urgent care center, or emergency room.  If you have a medical emergency, please immediately call 911 or go to the emergency department.  Pager Numbers  - Dr. Nehemiah Massed: [phone]  - Dr. Laurence Ferrari: [phone]  - Dr. Nicole Kindred: [phone]  In the event of inclement weather, please call our main line at  [phone] for an update on the status of any delays or closures.  Dermatology Medication Tips: Please keep the boxes that topical medications come in in order to help keep track of the instructions about where and how to use these. Pharmacies typically print the medication instructions only on the boxes and not directly on the medication tubes.   If your medication is too expensive, please contact our office at [phone] option 4 or send [place] a message through [place].   We are unable to tell what your co-pay for medications will be in advance as this is different depending on your insurance coverage. However, we may be able to find a substitute medication at lower cost or fill out paperwork to get insurance to cover a needed medication.   If a prior authorization is required to get your medication covered by your insurance company, please allow [place] 1-2 business days to complete this process.  Drug prices often vary depending on where the prescription is filled and some pharmacies may offer cheaper prices.  The website www.goodrx.com contains coupons for medications through different pharmacies. The prices here do not account for what the cost may be with help from insurance (it may be cheaper with your insurance), but the website can give you the price if you did not use any insurance.  - You can print the associated coupon and take it with your prescription to the pharmacy.  - You may also stop  by our office during regular business hours and pick up a GoodRx coupon card.  - If you need your prescription sent electronically to a different pharmacy, notify our office through [facility] or by phone at [phone] option 4.     Si Usted Necesita Algo Despus de Su Visita  Tambin puede enviarnos un mensaje a travs de Pharmacist, community. Por lo general respondemos a los mensajes de MyChart en el transcurso de 1 a 2 das hbiles.  Para renovar recetas, por favor pida a su farmacia que se  ponga en contacto con nuestra oficina. Harland Dingwall de fax es [place] [phone].  Si tiene un asunto urgente cuando la clnica est cerrada y que no puede esperar hasta el siguiente da hbil, puede llamar/localizar a su doctor(a) al nmero que aparece a continuacin.   Por favor, tenga en cuenta que aunque hacemos todo lo posible para estar disponibles para asuntos urgentes fuera del horario de [place], no estamos disponibles las 24 horas del da, los 7 das de la [place].   Si tiene un problema urgente y no puede comunicarse con nosotros, puede optar por buscar atencin mdica  en el consultorio de su doctor(a), en una clnica privada, en un centro de atencin urgente o en una sala de emergencias.  Si tiene Engineering geologist, por favor llame inmediatamente al 911 o vaya a la sala de emergencias.  Nmeros de bper  - Dr. Nehemiah Massed: [phone]  - Dra. Moye: [phone]  - Dra. Nicole Kindred: [phone]  En caso de inclemencias del [place], por favor llame a Johnsie Kindred principal al [phone] para una actualizacin sobre el [place] de cualquier retraso o cierre.  Consejos para la medicacin en dermatologa: Por favor, guarde las cajas en las que vienen los medicamentos de uso tpico para ayudarle a seguir las instrucciones sobre dnde y cmo usarlos. Las farmacias generalmente imprimen las instrucciones del medicamento slo en las cajas y no directamente en los tubos del [place].   Si su medicamento es muy caro, por favor, pngase en contacto con Zigmund Daniel llamando al [phone] y presione la opcin 4 o envenos un mensaje a travs de Pharmacist, community.   No podemos decirle cul ser su copago por los medicamentos por adelantado ya que esto es diferente dependiendo de la cobertura de su seguro. Sin embargo, es posible que podamos encontrar un medicamento sustituto a Electrical engineer un formulario para que el seguro cubra el medicamento que se considera necesario.   Si se requiere  una autorizacin previa para que su compaa de seguros [place] su medicamento, por favor permtanos de 1 a 2 das hbiles para completar este proceso.  Los precios de los medicamentos varan con frecuencia dependiendo del Environmental consultant de dnde se surte la receta y alguna farmacias pueden ofrecer precios ms baratos.  El sitio web www.goodrx.com tiene cupones para medicamentos de Airline pilot. Los precios aqu no tienen en cuenta lo que podra costar con la ayuda del seguro (puede ser ms barato con su seguro), pero el sitio web puede darle el precio si no utiliz Research scientist (physical sciences).  - Puede imprimir el cupn correspondiente y llevarlo con su receta a la farmacia.  - Tambin puede pasar por nuestra oficina durante el horario de atencin regular y Charity fundraiser una tarjeta de cupones de GoodRx.  - Si necesita que su receta se enve electrnicamente a una farmacia diferente, informe a nuestra oficina a travs de MyChart de [place] o por telfono llamando al [phone] y presione la opcin 4.

## 2021-12-02 ENCOUNTER — Encounter: Payer: Self-pay | Admitting: Dermatology

## 2022-07-17 ENCOUNTER — Ambulatory Visit: Payer: BC Managed Care – PPO | Admitting: Dermatology

## 2022-07-27 ENCOUNTER — Ambulatory Visit (INDEPENDENT_AMBULATORY_CARE_PROVIDER_SITE_OTHER): Payer: BC Managed Care – PPO | Admitting: Dermatology

## 2022-07-27 DIAGNOSIS — L719 Rosacea, unspecified: Secondary | ICD-10-CM | POA: Diagnosis not present

## 2022-07-27 DIAGNOSIS — L570 Actinic keratosis: Secondary | ICD-10-CM

## 2022-07-27 DIAGNOSIS — L578 Other skin changes due to chronic exposure to nonionizing radiation: Secondary | ICD-10-CM

## 2022-07-27 MED ORDER — DOXYCYCLINE HYCLATE 100 MG PO TABS
100.0000 mg | ORAL_TABLET | Freq: Every day | ORAL | 0 refills | Status: AC
Start: 2022-07-27 — End: 2022-08-26

## 2022-07-27 MED ORDER — IVERMECTIN 1 % EX CREA
TOPICAL_CREAM | CUTANEOUS | 6 refills | Status: DC
Start: 2022-07-27 — End: 2024-05-27

## 2022-07-27 NOTE — Progress Notes (Signed)
Follow-Up Visit   Subjective  Roger Wagner is a 61 y.o. male who presents for the following: Follow-up (8  months f/u on Rosacea on his face treating with Doxycycline 100 mg daily and Rosacea triple cream with a good response ). Recheck scalp hx of precancers.  The patient has spots, moles and lesions to be evaluated, some may be new or changing and the patient has concerns that these could be cancer.  The following portions of the chart were reviewed this encounter and updated as appropriate:   Tobacco  Allergies  Meds  Problems  Med Hx  Surg Hx  Fam Hx     Review of Systems:  No other skin or systemic complaints except as noted in HPI or Assessment and Plan.  Objective  Well appearing patient in no apparent distress; mood and affect are within normal limits.  A focused examination was performed including face,scalp . Relevant physical exam findings are noted in the Assessment and Plan.  face Mid face erythema with telangiectasias +/- scattered inflammatory papules.   scalp x 4 (4) Erythematous thin papules/macules with gritty scale.    Assessment & Plan  Rosacea face  Rosacea is a chronic progressive skin condition usually affecting the face of adults, causing redness and/or acne bumps. It is treatable but not curable. It sometimes affects the eyes (ocular rosacea) as well. It may respond to topical and/or systemic medication and can flare with stress, sun exposure, alcohol, exercise, topical steroids (including hydrocortisone/cortisone 10) and some foods.  Daily application of broad spectrum spf 30+ sunscreen to face is recommended to reduce flares.   Continue [facility] Rosacea triple cream qd-bid  Continue Doxycycline 100 mg take 1 tablet daily   Doxycycline should be taken with food to prevent nausea. Do not lay down for 30 minutes after taking. Be cautious with sun exposure and use good sun protection while on this medication. Pregnant women should not take  this medication.     Related Medications doxycycline (VIBRA-TABS) 100 MG tablet Take 1 tablet (100 mg total) by mouth daily.  Ivermectin 1 % CREA Apply to face qd-bid  AK (actinic keratosis) (4) scalp x 4  Actinic keratoses are precancerous spots that appear secondary to cumulative UV radiation exposure/sun exposure over time. They are chronic with expected duration over 1 year. A portion of actinic keratoses will progress to squamous cell carcinoma of the skin. It is not possible to reliably predict which spots will progress to skin cancer and so treatment is recommended to prevent development of skin cancer.  Recommend daily broad spectrum sunscreen SPF 30+ to sun-exposed areas, reapply every 2 hours as needed.  Recommend staying in the shade or wearing long sleeves, sun glasses (UVA+UVB protection) and wide brim hats (4-inch brim around the entire circumference of the hat). Call for new or changing lesions.   Destruction of lesion - scalp x 4 Complexity: simple   Destruction method: cryotherapy   Informed consent: discussed and consent obtained   Timeout:  patient name, date of birth, surgical site, and procedure verified Lesion destroyed using liquid nitrogen: Yes   Region frozen until ice [place] extended beyond lesion: Yes   Outcome: patient tolerated procedure well with no complications   Post-procedure details: wound care instructions given    Actinic skin damage  Actinic Damage - chronic, secondary to cumulative UV radiation exposure/sun exposure over time - diffuse scaly erythematous macules with underlying dyspigmentation - Recommend daily broad spectrum sunscreen SPF 30+ to sun-exposed  areas, reapply every 2 hours as needed.  - Recommend staying in the shade or wearing long sleeves, sun glasses (UVA+UVB protection) and wide brim hats (4-inch brim around the entire circumference of the hat). - Call for new or changing lesions.   Return in about 1 year (around 07/28/2023)  for Aks, Rosacea .  IMarye Round, CMA, am acting as scribe for Sarina Ser, MD .  Documentation: I have reviewed the above documentation for accuracy and completeness, and I agree with the above.  Sarina Ser, MD

## 2022-07-27 NOTE — Patient Instructions (Addendum)
Cryotherapy Aftercare  Wash gently with soap and water everyday.   Apply Vaseline and Band-Aid daily until healed.     Due to recent changes in healthcare laws, you may see results of your pathology and/or laboratory studies on MyChart before the doctors have had a chance to review them. We understand that in some cases there may be results that are confusing or concerning to you. Please understand that not all results are received at the same time and often the doctors may need to interpret multiple results in order to provide you with the best plan of care or course of treatment. Therefore, we ask that you please give us 2 business days to thoroughly review all your results before contacting the office for clarification. Should we see a critical lab result, you will be contacted sooner.   If You Need Anything After Your Visit  If you have any questions or concerns for your doctor, please call our main line at [phone] and press option 4 to reach your doctor's medical assistant. If no one answers, please leave a voicemail as directed and we will return your call as soon as possible. Messages left after 4 pm will be answered the following business day.   You may also send us a message via MyChart. We typically respond to MyChart messages within 1-2 business days.  For prescription refills, please ask your pharmacy to contact our office. Our fax number is [phone].  If you have an urgent issue when the clinic is closed that cannot wait until the next business day, you can page your doctor at the number below.    Please note that while we do our best to be available for urgent issues outside of office hours, we are not available 24/7.   If you have an urgent issue and are unable to reach us, you may choose to seek medical care at your doctor's office, retail clinic, urgent care center, or emergency room.  If you have a medical emergency, please immediately call 911 or go to the  emergency department.  Pager Numbers  - Dr. Kowalski: [phone]  - Dr. Moye: [phone]  - Dr. Stewart: [phone]  In the event of inclement weather, please call our main line at [phone] for an update on the status of any delays or closures.  Dermatology Medication Tips: Please keep the boxes that topical medications come in in order to help keep track of the instructions about where and how to use these. Pharmacies typically print the medication instructions only on the boxes and not directly on the medication tubes.   If your medication is too expensive, please contact our office at [phone] option 4 or send us a message through MyChart.   We are unable to tell what your co-pay for medications will be in advance as this is different depending on your insurance coverage. However, we may be able to find a substitute medication at lower cost or fill out paperwork to get insurance to cover a needed medication.   If a prior authorization is required to get your medication covered by your insurance company, please allow us 1-2 business days to complete this process.  Drug prices often vary depending on where the prescription is filled and some pharmacies may offer cheaper prices.  The website www.goodrx.com contains coupons for medications through different pharmacies. The prices here do not account for what the cost may be with help from insurance (it may be cheaper with your insurance), but the website can   give you the price if you did not use any insurance.  - You can print the associated coupon and take it with your prescription to the pharmacy.  - You may also stop by our office during regular business hours and pick up a GoodRx coupon card.  - If you need your prescription sent electronically to a different pharmacy, notify our office through [place] MyChart or by phone at [phone] option 4.     Si Usted Necesita Algo Despus de Su Visita  Tambin puede  enviarnos un mensaje a travs de MyChart. Por lo general respondemos a los mensajes de MyChart en el transcurso de 1 a 2 das hbiles.  Para renovar recetas, por favor pida a su farmacia que se ponga en contacto con nuestra oficina. Nuestro nmero de fax es el [phone].  Si tiene un asunto urgente cuando la clnica est cerrada y que no puede esperar hasta el siguiente da hbil, puede llamar/localizar a su doctor(a) al nmero que aparece a continuacin.   Por favor, tenga en cuenta que aunque hacemos todo lo posible para estar disponibles para asuntos urgentes fuera del horario de oficina, no estamos disponibles las 24 horas del da, los 7 das de la semana.   Si tiene un problema urgente y no puede comunicarse con nosotros, puede optar por buscar atencin mdica  en el consultorio de su doctor(a), en una clnica privada, en un centro de atencin urgente o en una sala de emergencias.  Si tiene una emergencia mdica, por favor llame inmediatamente al 911 o vaya a la sala de emergencias.  Nmeros de bper  - Dr. Kowalski: [phone]  - Dra. Moye: [phone]  - Dra. Stewart: [phone]  En caso de inclemencias del tiempo, por favor llame a nuestra lnea principal al [phone] para una actualizacin sobre el estado de cualquier retraso o cierre.  Consejos para la medicacin en dermatologa: Por favor, guarde las cajas en las que vienen los medicamentos de uso tpico para ayudarle a seguir las instrucciones sobre dnde y cmo usarlos. Las farmacias generalmente imprimen las instrucciones del medicamento slo en las cajas y no directamente en los tubos del medicamento.   Si su medicamento es muy caro, por favor, pngase en contacto con nuestra oficina llamando al [phone] y presione la opcin 4 o envenos un mensaje a travs de MyChart.   No podemos decirle cul ser su copago por los medicamentos por adelantado ya que esto es diferente dependiendo de la cobertura de su seguro.  Sin embargo, es posible que podamos encontrar un medicamento sustituto a menor costo o llenar un formulario para que el seguro cubra el medicamento que se considera necesario.   Si se requiere una autorizacin previa para que su compaa de seguros cubra su medicamento, por favor permtanos de 1 a 2 das hbiles para completar este proceso.  Los precios de los medicamentos varan con frecuencia dependiendo del lugar de dnde se surte la receta y alguna farmacias pueden ofrecer precios ms baratos.  El sitio web www.goodrx.com tiene cupones para medicamentos de diferentes farmacias. Los precios aqu no tienen en cuenta lo que podra costar con la ayuda del seguro (puede ser ms barato con su seguro), pero el sitio web puede darle el precio si no utiliz ningn seguro.  - Puede imprimir el cupn correspondiente y llevarlo con su receta a la farmacia.  - Tambin puede pasar por nuestra oficina durante el horario de atencin regular y recoger una tarjeta de cupones de GoodRx.  -   Si necesita que su receta se enve electrnicamente a una farmacia diferente, informe a nuestra oficina a travs de MyChart de [place] o por telfono llamando al [phone] y presione la opcin 4.  

## 2022-08-07 ENCOUNTER — Encounter: Payer: Self-pay | Admitting: Dermatology

## 2023-04-27 ENCOUNTER — Telehealth: Payer: Self-pay

## 2023-04-27 ENCOUNTER — Ambulatory Visit (AMBULATORY_SURGERY_CENTER): Payer: BC Managed Care – PPO

## 2023-04-27 ENCOUNTER — Encounter: Payer: Self-pay | Admitting: Gastroenterology

## 2023-04-27 VITALS — Ht 72.0 in | Wt 210.0 lb

## 2023-04-27 DIAGNOSIS — Z86010 Personal history of colonic polyps: Secondary | ICD-10-CM

## 2023-04-27 MED ORDER — NA SULFATE-K SULFATE-MG SULF 17.5-3.13-1.6 GM/177ML PO SOLN: 1.0000 | Freq: Once | ORAL | 0 refills | Status: AC

## 2023-04-27 NOTE — Progress Notes (Signed)

## 2023-04-27 NOTE — Telephone Encounter (Signed)
Spoke with patient during pre visit about his colonoscopy scheduled with Dr. Russella Dar.  His previous colonoscopy was with Dr. Rhea Belton.  Patient requested Dr. Russella Dar so that he could schedule his procedure earlier.

## 2023-04-30 NOTE — Telephone Encounter (Signed)
That is okay with me and he can remain my patient JMP

## 2023-05-15 ENCOUNTER — Encounter: Payer: Self-pay | Admitting: Gastroenterology

## 2023-05-15 ENCOUNTER — Ambulatory Visit (AMBULATORY_SURGERY_CENTER): Payer: BC Managed Care – PPO | Admitting: Gastroenterology

## 2023-05-15 VITALS — BP 117/68 | HR 58 | Temp 97.7°F | Resp 11 | Ht 72.0 in | Wt 210.0 lb

## 2023-05-15 DIAGNOSIS — D120 Benign neoplasm of cecum: Secondary | ICD-10-CM

## 2023-05-15 DIAGNOSIS — D123 Benign neoplasm of transverse colon: Secondary | ICD-10-CM

## 2023-05-15 DIAGNOSIS — Z86010 Personal history of colonic polyps: Secondary | ICD-10-CM | POA: Diagnosis not present

## 2023-05-15 DIAGNOSIS — D122 Benign neoplasm of ascending colon: Secondary | ICD-10-CM | POA: Diagnosis not present

## 2023-05-15 DIAGNOSIS — Z1211 Encounter for screening for malignant neoplasm of colon: Secondary | ICD-10-CM | POA: Diagnosis not present

## 2023-05-15 DIAGNOSIS — K635 Polyp of colon: Secondary | ICD-10-CM | POA: Diagnosis not present

## 2023-05-15 DIAGNOSIS — Z09 Encounter for follow-up examination after completed treatment for conditions other than malignant neoplasm: Secondary | ICD-10-CM | POA: Diagnosis not present

## 2023-05-15 MED ORDER — SODIUM CHLORIDE 0.9 % IV SOLN: 500.0000 mL | INTRAVENOUS | Status: DC

## 2023-05-15 NOTE — Progress Notes (Signed)
History & Physical  Primary Care Physician:  Etta Grandchild, MD Primary Gastroenterologist: Claudette Head, MD  Impression / Plan:  Personal history of adenomatous colon polyps including a 3 cm tubular adenoma in 2015 for surveillance colonoscopy.  CHIEF COMPLAINT:  Personal history of colon polyps   HPI: Roger Wagner is a 62 y.o. male with a personal history of adenomatous colon polyps for surveillance colonoscopy.    Past Medical History:  Diagnosis Date   Allergy    mild - no medicines    Anxiety    Arthritis    left wrist   Chronic kidney disease    kidney stones   Complication of anesthesia    hard to wake up   History of kidney stones 2015   Neuromuscular disorder (HCC)    hx carpal tunnel left     Past Surgical History:  Procedure Laterality Date   CARPAL TUNNEL RELEASE Left 11/28/2017   Procedure: CARPAL TUNNEL RELEASE;  Surgeon: Donato Heinz, MD;  Location: [facility] ORS;  Service: Orthopedics;  Laterality: Left;   COLONOSCOPY  2015   GANGLION CYST EXCISION Left 11/28/2017   Procedure: REMOVAL GANGLION OF DORSAL WRIST;  Surgeon: Donato Heinz, MD;  Location: [facility] ORS;  Service: Orthopedics;  Laterality: Left;   POLYPECTOMY     wrist Left 1987   WRIST SURGERY  02/17/1986    Prior to Admission medications   Medication Sig Start Date End Date Taking? Authorizing Provider  Multiple Vitamin (MULTIVITAMIN WITH MINERALS) TABS tablet Take 1 tablet by mouth daily.   Yes [provider]  Ivermectin 1 % CREA Apply to face qd-bid Patient not taking: Reported on 04/27/2023 07/27/22   Deirdre Evener, MD  neomycin-polymyxin b-dexamethasone (MAXITROL) 3.5-10000-0.1 SUSP Place 2 drops into both eyes every 6 (six) hours. Patient not taking: Reported on 04/27/2023 07/19/21   Etta Grandchild, MD    Current Outpatient Medications  Medication Sig Dispense Refill   Multiple Vitamin (MULTIVITAMIN WITH MINERALS) TABS tablet Take 1 tablet by mouth daily.      Ivermectin 1 % CREA Apply to face qd-bid (Patient not taking: Reported on 04/27/2023) 45 g 6   neomycin-polymyxin b-dexamethasone (MAXITROL) 3.5-10000-0.1 SUSP Place 2 drops into both eyes every 6 (six) hours. (Patient not taking: Reported on 04/27/2023) 5 mL 0   Current Facility-Administered Medications  Medication Dose Route Frequency Provider Last Rate Last Admin   0.9 %  sodium chloride infusion  500 mL Intravenous Continuous Meryl Dare, MD        Allergies as of 05/15/2023 - Review Complete 05/15/2023  Allergen Reaction Noted   Sulfa antibiotics Hives, Rash, and Other (See Comments) 07/24/2014   Sulfasalazine Hives and Rash 07/24/2014    Family History  Problem Relation Age of Onset   Stroke Father    Colon cancer Father    Cancer Other        Colon Cancer   Diabetes Other    Stroke Other    Colon polyps Neg Hx    Rectal cancer Neg Hx    Stomach cancer Neg Hx    Esophageal cancer Neg Hx     Social History   Socioeconomic History   Marital status: Married    Spouse name: Not on file   Number of children: Not on file   Years of education: Not on file   Highest education level: Not on file  Occupational History   Not on file  Tobacco Use  Smoking status: Never   Smokeless tobacco: Never  Vaping Use   Vaping status: Never Used  Substance and Sexual Activity   Alcohol use: Not Currently   Drug use: No   Sexual activity: Yes  Other Topics Concern   Not on file  Social History Narrative   Not on file   Social Determinants of Health   Financial Resource Strain: Not on file  Food Insecurity: Not on file  Transportation Needs: Not on file  Physical Activity: Not on file  Stress: Not on file  Social Connections: Not on file  Intimate Partner Violence: Not on file    Review of Systems:  All systems reviewed were negative except where noted in HPI.   Physical Exam:  General:  Alert, well-developed, in NAD Head:  Normocephalic and atraumatic. Eyes:   Sclera clear, no icterus.   Conjunctiva pink. Ears:  Normal auditory acuity. Mouth:  No deformity or lesions.  Neck:  Supple; no masses. Lungs:  Clear throughout to auscultation.   No wheezes, crackles, or rhonchi.  Heart:  Regular rate and rhythm; no murmurs. Abdomen:  Soft, nondistended, nontender. No masses, hepatomegaly. No palpable masses.  Normal bowel sounds.    Rectal:  Deferred   Msk:  Symmetrical without gross deformities. Extremities:  Without edema. Neurologic:  Alert and  oriented x 4; grossly normal neurologically. Skin:  Intact without significant lesions or rashes. Psych:  Alert and cooperative. Normal mood and affect.   Venita Lick. Russella Dar  05/15/2023, 4:11 PM See Loretha Stapler, [facility], to contact our on call provider

## 2023-05-15 NOTE — Progress Notes (Signed)
Called to room to assist during endoscopic procedure.  Patient ID and intended procedure confirmed with present staff. Received instructions for my participation in the procedure from the performing physician.  

## 2023-05-15 NOTE — Op Note (Signed)
[name] Procedure Date: 05/15/2023 4:08 PM MRN: [id number] Endoscopist: Meryl Dare , MD, [phone] Age: 62 Referring MD:  Date of Birth: 06-11-1961 Gender: Male Account #: [id number] Procedure:                Colonoscopy Indications:              Surveillance: Personal history of adenomatous                            polyps on last colonoscopy 5 years ago Medicines:                Monitored Anesthesia Care Procedure:                Pre-Anesthesia Assessment:                           - Prior to the procedure, a History and Physical                            was performed, and patient medications and                            allergies were reviewed. The patient's tolerance of                            previous anesthesia was also reviewed. The risks                            and benefits of the procedure and the sedation                            options and risks were discussed with the patient.                            All questions were answered, and informed consent                            was obtained. Prior Anticoagulants: The patient has                            taken no anticoagulant or antiplatelet agents. ASA                            Grade Assessment: II - A patient with mild systemic                            disease. After reviewing the risks and benefits,                            the patient was deemed in satisfactory condition to                            undergo the procedure.  After obtaining informed consent, the colonoscope                            was passed under direct vision. Throughout the                            procedure, the patient's blood pressure, pulse, and                            oxygen saturations were monitored continuously. The                            Olympus Scope SN: [id number] was introduced through                            the anus and advanced to  the the cecum, identified                            by appendiceal orifice and ileocecal valve. The                            ileocecal valve, appendiceal orifice, and rectum                            were photographed. The quality of the bowel                            preparation was adequate. The colonoscopy was                            performed without difficulty. The patient tolerated                            the procedure well. Scope In: 4:18:13 PM Scope Out: 4:30:36 PM Scope Withdrawal Time: 0 hours 10 minutes 10 seconds  Total Procedure Duration: 0 hours 12 minutes 23 seconds  Findings:                 The perianal and digital rectal examinations were                            normal.                           A 2 mm polyp was found in the ascending colon. The                            polyp was sessile. The polyp was removed with a                            cold biopsy forceps. Resection and retrieval were                            complete.  Two sessile polyps were found in the transverse                            colon and cecum. The polyps were 5 to 6 mm in size.                            These polyps were removed with a cold snare.                            Resection and retrieval were complete.                           A tattoo was seen in the sigmoid colon. A                            post-polypectomy scar was found at the tattoo site.                           The exam was otherwise without abnormality on                            direct and retroflexion views. Complications:            No immediate complications. Estimated blood loss:                            None. Estimated Blood Loss:     Estimated blood loss: none. Impression:               - One 2 mm polyp in the ascending colon, removed                            with a cold biopsy forceps. Resected and retrieved.                           - Two 5 to 6 mm polyps in the  transverse colon and                            in the cecum, removed with a cold snare. Resected                            and retrieved.                           - A tattoo was seen in the sigmoid colon. A                            post-polypectomy scar was found at the tattoo site.                           - The examination was otherwise normal on direct  and retroflexion views. Recommendation:           - Repeat colonoscopy after studies are complete for                            surveillance based on pathology results.                           - Patient has a contact number available for                            emergencies. The signs and symptoms of potential                            delayed complications were discussed with the                            patient. Return to normal activities tomorrow.                            Written discharge instructions were provided to the                            patient.                           - Resume previous diet.                           - Continue present medications.                           - Await pathology results. Meryl Dare, MD 05/15/2023 4:43:52 PM This report has been signed electronically.

## 2023-05-15 NOTE — Progress Notes (Signed)
Patient states there have been no changes to medical or surgical history since time of pre-visit. 

## 2023-05-15 NOTE — Patient Instructions (Signed)
Discharge instructions given. Handouts on polyps and Hemorrhoids. Resume previous medications. YOU HAD AN ENDOSCOPIC PROCEDURE TODAY AT THE [facility]:   Refer to the procedure report that was given to you for any specific questions about what was found during the examination.  If the procedure report does not answer your questions, please call your gastroenterologist to clarify.  If you requested that your care partner not be given the details of your procedure findings, then the procedure report has been included in a sealed envelope for you to review at your convenience later.  YOU SHOULD EXPECT: Some feelings of bloating in the abdomen. Passage of more gas than usual.  Walking can help get rid of the air that was put into your GI tract during the procedure and reduce the bloating. If you had a lower endoscopy (such as a colonoscopy or flexible sigmoidoscopy) you may notice spotting of blood in your stool or on the toilet paper. If you underwent a bowel prep for your procedure, you may not have a normal bowel movement for a few days.  Please Note:  You might notice some irritation and congestion in your nose or some drainage.  This is from the oxygen used during your procedure.  There is no need for concern and it should clear up in a day or so.  SYMPTOMS TO REPORT IMMEDIATELY:  Following lower endoscopy (colonoscopy or flexible sigmoidoscopy):  Excessive amounts of blood in the stool  Significant tenderness or worsening of abdominal pains  Swelling of the abdomen that is new, acute  Fever of 100F or higher   For urgent or emergent issues, a gastroenterologist can be reached at any hour by calling [phone]. Do not use MyChart messaging for urgent concerns.    DIET:  We do recommend a small meal at first, but then you may proceed to your regular diet.  Drink plenty of fluids but you should avoid alcoholic beverages for 24 hours.  ACTIVITY:  You should plan to take it  easy for the rest of today and you should NOT DRIVE or use heavy machinery until tomorrow (because of the sedation medicines used during the test).    FOLLOW UP: Our staff will call the number listed on your records the next business day following your procedure.  We will call around 7:15- 8:00 am to check on you and address any questions or concerns that you may have regarding the information given to you following your procedure. If we do not reach you, we will leave a message.     If any biopsies were taken you will be contacted by phone or by letter within the next 1-3 weeks.  Please call us at [phone] if you have not heard about the biopsies in 3 weeks.    SIGNATURES/CONFIDENTIALITY: You and/or your care partner have signed paperwork which will be entered into your electronic medical record.  These signatures attest to the fact that that the information above on your After Visit Summary has been reviewed and is understood.  Full responsibility of the confidentiality of this discharge information lies with you and/or your care-partner. 

## 2023-05-15 NOTE — Progress Notes (Signed)
Report to PACU, RN, vss, BBS= Clear.  

## 2023-05-16 ENCOUNTER — Telehealth: Payer: Self-pay

## 2023-05-16 NOTE — Telephone Encounter (Signed)
Attempted f/u call. No answer, left VM. 

## 2023-06-01 ENCOUNTER — Encounter: Payer: Self-pay | Admitting: Gastroenterology

## 2023-08-01 ENCOUNTER — Ambulatory Visit: Payer: BC Managed Care – PPO | Admitting: Dermatology

## 2023-08-01 DIAGNOSIS — L719 Rosacea, unspecified: Secondary | ICD-10-CM

## 2023-08-01 DIAGNOSIS — L578 Other skin changes due to chronic exposure to nonionizing radiation: Secondary | ICD-10-CM | POA: Diagnosis not present

## 2023-08-01 DIAGNOSIS — W908XXA Exposure to other nonionizing radiation, initial encounter: Secondary | ICD-10-CM | POA: Diagnosis not present

## 2023-08-01 DIAGNOSIS — Z79899 Other long term (current) drug therapy: Secondary | ICD-10-CM

## 2023-08-01 DIAGNOSIS — Z7189 Other specified counseling: Secondary | ICD-10-CM

## 2023-08-01 DIAGNOSIS — L570 Actinic keratosis: Secondary | ICD-10-CM | POA: Diagnosis not present

## 2023-08-01 DIAGNOSIS — Z5111 Encounter for antineoplastic chemotherapy: Secondary | ICD-10-CM

## 2023-08-01 MED ORDER — DOXYCYCLINE MONOHYDRATE 50 MG PO CAPS
ORAL_CAPSULE | ORAL | 3 refills | Status: DC
Start: 2023-08-01 — End: 2024-05-12

## 2023-08-01 NOTE — Progress Notes (Signed)
Follow-Up Visit   Subjective  Roger Wagner is a 62 y.o. male who presents for the following: 1 year follow up on rosacea and ak follow up.   Patient reports some rough areas at scalp.   The patient has spots, moles and lesions to be evaluated, some may be new or changing and the patient may have concern these could be cancer.   The following portions of the chart were reviewed this encounter and updated as appropriate: medications, allergies, medical history  Review of Systems:  No other skin or systemic complaints except as noted in HPI or Assessment and Plan.  Objective  Well appearing patient in no apparent distress; mood and affect are within normal limits.   A focused examination was performed of the following areas: Scalp and face,   Relevant exam findings are noted in the Assessment and Plan.  Scalp x 15 (15) Erythematous thin papules/macules with gritty scale.     Assessment & Plan   Rosacea face   Rosacea is a chronic progressive skin condition usually affecting the face of adults, causing redness and/or acne bumps. It is treatable but not curable. It sometimes affects the eyes (ocular rosacea) as well. It may respond to topical and/or systemic medication and can flare with stress, sun exposure, alcohol, exercise, topical steroids (including hydrocortisone/cortisone 10) and some foods.  Daily application of broad spectrum spf 30+ sunscreen to face is recommended to reduce flares.    Continue [facility] Rosacea triple cream qd-bid  Continue Doxycycline 100 mg take 1 tablet daily    Doxycycline should be taken with food to prevent nausea. Do not lay down for 30 minutes after taking. Be cautious with sun exposure and use good sun protection while on this medication. Pregnant women should not take this medication.     Rosacea  Related Medications Ivermectin 1 % CREA Apply to face qd-bid  doxycycline (MONODOX) 50 MG capsule 50 mg po qd with  food  Actinic keratosis (15) Scalp x 15  In 1 month start - Start 5-fluorouracil/calcipotriene cream twice a day for 10 days to affected areas including scalp. Prescription sent to [facility]. Patient advised they will receive an email to purchase the medication online and have it sent to their home. Patient provided with handout reviewing treatment course and side effects and advised to call or message [place] on MyChart with any concerns.  Reviewed course of treatment and expected reaction.  Patient advised to expect inflammation and crusting and advised that erosions are possible.  Patient advised to be diligent with sun protection during and after treatment. Counseled to keep medication out of reach of children and pets.    Actinic keratoses are precancerous spots that appear secondary to cumulative UV radiation exposure/sun exposure over time. They are chronic with expected duration over 1 year. A portion of actinic keratoses will progress to squamous cell carcinoma of the skin. It is not possible to reliably predict which spots will progress to skin cancer and so treatment is recommended to prevent development of skin cancer.  Recommend daily broad spectrum sunscreen SPF 30+ to sun-exposed areas, reapply every 2 hours as needed.  Recommend staying in the shade or wearing long sleeves, sun glasses (UVA+UVB protection) and wide brim hats (4-inch brim around the entire circumference of the hat). Call for new or changing lesions.  Destruction of lesion - Scalp x 15 (15) Complexity: simple   Destruction method: cryotherapy   Informed consent: discussed and consent obtained  Timeout:  patient name, date of birth, surgical site, and procedure verified Lesion destroyed using liquid nitrogen: Yes   Region frozen until ice Bares extended beyond lesion: Yes   Outcome: patient tolerated procedure well with no complications   Post-procedure details: wound care instructions  given     ACTINIC DAMAGE WITH PRECANCEROUS ACTINIC KERATOSES Counseling for Topical Chemotherapy Management: Patient exhibits: - Severe, confluent actinic changes with pre-cancerous actinic keratoses that is secondary to cumulative UV radiation exposure over time - Condition that is severe; chronic, not at goal. - diffuse scaly erythematous macules and papules with underlying dyspigmentation - Discussed Prescription "Field Treatment" topical Chemotherapy for Severe, Chronic Confluent Actinic Changes with Pre-Cancerous Actinic Keratoses Field treatment involves treatment of an entire area of skin that has confluent Actinic Changes (Sun/ Ultraviolet light damage) and PreCancerous Actinic Keratoses by method of PhotoDynamic Therapy (PDT) and/or prescription Topical Chemotherapy agents such as 5-fluorouracil, 5-fluorouracil/calcipotriene, and/or imiquimod.  The purpose is to decrease the number of clinically evident and subclinical PreCancerous lesions to prevent progression to development of skin cancer by chemically destroying early precancer changes that may or may not be visible.  It has been shown to reduce the risk of developing skin cancer in the treated area. As a result of treatment, redness, scaling, crusting, and open sores may occur during treatment course. One or more than one of these methods may be used and may have to be used several times to control, suppress and eliminate the PreCancerous changes. Discussed treatment course, expected reaction, and possible side effects. - Recommend daily broad spectrum sunscreen SPF 30+ to sun-exposed areas, reapply every 2 hours as needed.  - Staying in the shade or wearing long sleeves, sun glasses (UVA+UVB protection) and wide brim hats (4-inch brim around the entire circumference of the hat) are also recommended. - Call for new or changing lesions.  Start in 1 month  - Start 5-fluorouracil/calcipotriene cream twice a day for 10 days to affected  areas including scalp. Prescription sent to [facility]. Patient advised they will receive an email to purchase the medication online and have it sent to their home. Patient provided with handout reviewing treatment course and side effects and advised to call or message [place] on MyChart with any concerns.  Reviewed course of treatment and expected reaction.  Patient advised to expect inflammation and crusting and advised that erosions are possible.  Patient advised to be diligent with sun protection during and after treatment. Counseled to keep medication out of reach of children and pets.  Return for 8 month ak follow up.  IAsher Muir, CMA, am acting as scribe for Armida Sans, MD.   Documentation: I have reviewed the above documentation for accuracy and completeness, and I agree with the above.  Armida Sans, MD

## 2023-08-01 NOTE — Patient Instructions (Addendum)
Start cream at 1 month .  - Start 5-fluorouracil/calcipotriene cream twice a day for 10 days to affected areas including scalp. Prescription sent to [facility]. Patient advised they will receive an email to purchase the medication online and have it sent to their home. Patient provided with handout reviewing treatment course and side effects and advised to call or message [place] on MyChart with any concerns.  Reviewed course of treatment and expected reaction.  Patient advised to expect inflammation and crusting and advised that erosions are possible.  Patient advised to be diligent with sun protection during and after treatment. Counseled to keep medication out of reach of children and pets.   Instructions for [facility]  One or more of your medications was sent to the [facility]. You will receive an email from them and can purchase the medicine through that link. It will then be mailed to your home at the address you confirmed. If for any reason you do not receive an email from them, please check your spam folder. If you still do not find the email, please let us know. [facility] phone number is [phone].     5-Fluorouracil/Calcipotriene Patient Education   Actinic keratoses are the dry, red scaly spots on the skin caused by sun damage. A portion of these spots can turn into skin cancer with time, and treating them can help prevent development of skin cancer.   Treatment of these spots requires removal of the defective skin cells. There are various ways to remove actinic keratoses, including freezing with liquid nitrogen, treatment with creams, or treatment with a blue light procedure in the office.   5-fluorouracil cream is a topical cream used to treat actinic keratoses. It works by interfering with the growth of abnormal fast-growing skin cells, such as actinic keratoses. These cells peel off and are  replaced by healthy ones.   5-fluorouracil/calcipotriene is a combination of the 5-fluorouracil cream with a vitamin D analog cream called calcipotriene. The calcipotriene alone does not treat actinic keratoses. However, when it is combined with 5-fluorouracil, it helps the 5-fluorouracil treat the actinic keratoses much faster so that the same results can be achieved with a much shorter treatment time.  INSTRUCTIONS FOR 5-FLUOROURACIL/CALCIPOTRIENE CREAM:   5-fluorouracil/calcipotriene cream typically only needs to be used for 4-7 days. A thin layer should be applied twice a day to the treatment areas recommended by your physician.   If your physician prescribed you separate tubes of 5-fluourouracil and calcipotriene, apply a thin layer of 5-fluorouracil followed by a thin layer of calcipotriene.   Avoid contact with your eyes, nostrils, and mouth. Do not use 5-fluorouracil/calcipotriene cream on infected or open wounds.   You will develop redness, irritation and some crusting at areas where you have pre-cancer damage/actinic keratoses. IF YOU DEVELOP PAIN, BLEEDING, OR SIGNIFICANT CRUSTING, STOP THE TREATMENT EARLY - you have already gotten a good response and the actinic keratoses should clear up well.  Wash your hands after applying 5-fluorouracil 5% cream on your skin.   A moisturizer or sunscreen with a minimum SPF 30 should be applied each morning.   Once you have finished the treatment, you can apply a thin layer of Vaseline twice a day to irritated areas to soothe and calm the areas more quickly. If you experience significant discomfort, contact your physician.  For some patients it is necessary to repeat the treatment for best results.  SIDE EFFECTS: When using 5-fluorouracil/calcipotriene cream,  you may have mild irritation, such as redness, dryness, swelling, or a mild burning sensation. This usually resolves within 2 weeks. The more actinic keratoses you have, the more redness and  inflammation you can expect during treatment. Eye irritation has been reported rarely. If this occurs, please let us know.  If you have any trouble using this cream, please call the office. If you have any other questions about this information, please do not hesitate to ask me before you leave the office.     Continue doxycycline 100 mg as directed for flares   Doxycycline should be taken with food to prevent nausea. Do not lay down for 30 minutes after taking. Be cautious with sun exposure and use good sun protection while on this medication. Pregnant women should not take this medication.     Rosacea  What is rosacea? Rosacea (say: ro-zay-sha) is a common skin disease that usually begins as a trend of flushing or blushing easily.  As rosacea progresses, a persistent redness in the center of the face will develop and may gradually spread beyond the nose and cheeks to the forehead and chin.  In some cases, the ears, chest, and back could be affected.  Rosacea may appear as tiny blood vessels or small red bumps that occur in crops.  Frequently they can contain pus, and are called "pustules".  If the bumps do not contain pus, they are referred to as "papules".  Rarely, in prolonged, untreated cases of rosacea, the oil glands of the nose and cheeks may become permanently enlarged.  This is called rhinophyma, and is seen more frequently in men.  Signs and Risks In its beginning stages, rosacea tends to come and go, which makes it difficult to recognize.  It can start as intermittent flushing of the face.  Eventually, blood vessels may become permanently visible.  Pustules and papules can appear, but can be mistaken for adult acne.  People of all races, ages, genders and ethnic groups are at risk of developing rosacea.  However, it is more common in women (especially around menopause) and adults with fair skin between the ages of 47 and 73.  Treatment Dermatologists typically recommend a combination  of treatments to effectively manage rosacea.  Treatment can improve symptoms and may stop the progression of the rosacea.  Treatment may involve both topical and oral medications.  The tetracycline antibiotics are often used for their anti-inflammatory effect; however, because of the possibility of developing antibiotic resistance, they should not be used long term at full dose.  For dilated blood vessels the options include electrodessication (uses electric current through a small needle), laser treatment, and cosmetics to hide the redness.   With all forms of treatment, improvement is a slow process, and patients may not see any results for the first 3-4 weeks.  It is very important to avoid the sun and other triggers.  Patients must wear sunscreen daily.  Skin Care Instructions: Cleanse the skin with a mild soap such as CeraVe cleanser, Cetaphil cleanser, or Dove soap once or twice daily as needed. Moisturize with Eucerin Redness Relief Daily Perfecting Lotion (has a subtle green tint), CeraVe Moisturizing Cream, or Oil of Olay Daily Moisturizer with sunscreen every morning and/or night as recommended. Makeup should be "non-comedogenic" (won't clog pores) and be labeled "for sensitive skin". Good choices for cosmetics are: Neutrogena, Almay, and Physician's Formula.  Any product with a green tint tends to offset a red complexion. If your eyes are dry and irritated,  use artificial tears 2-3 times per day and cleanse the eyelids daily with baby shampoo.  Have your eyes examined at least every 2 years.  Be sure to tell your eye doctor that you have rosacea. Alcoholic beverages tend to cause flushing of the skin, and may make rosacea worse. Always wear sunscreen, protect your skin from extreme hot and cold temperatures, and avoid spicy foods, hot drinks, and mechanical irritation such as rubbing, scrubbing, or massaging the face.  Avoid harsh skin cleansers, cleansing masks, astringents, and exfoliation. If  a particular product burns or makes your face feel tight, then it is likely to flare your rosacea. If you are having difficulty finding a sunscreen that you can tolerate, you may try switching to a chemical-free sunscreen.  These are ones whose active ingredient is zinc oxide or titanium dioxide only.  They should also be fragrance free, non-comedogenic, and labeled for sensitive skin. Rosacea triggers may vary from person to person.  There are a variety of foods that have been reported to trigger rosacea.  Some patients find that keeping a diary of what they were doing when they flared helps them avoid triggers.       Due to recent changes in healthcare laws, you may see results of your pathology and/or laboratory studies on MyChart before the doctors have had a chance to review them. We understand that in some cases there may be results that are confusing or concerning to you. Please understand that not all results are received at the same time and often the doctors may need to interpret multiple results in order to provide you with the best plan of care or course of treatment. Therefore, we ask that you please give [place] 2 business days to thoroughly review all your results before contacting the office for clarification. Should we see a critical lab result, you will be contacted sooner.   If You Need Anything After Your Visit  If you have any questions or concerns for your doctor, please call our main line at [phone] and press option 4 to reach your doctor's medical assistant. If no one answers, please leave a voicemail as directed and we will return your call as soon as possible. Messages left after 4 pm will be answered the following business day.   You may also send [place] a message via MyChart. We typically respond to MyChart messages within 1-2 business days.  For prescription refills, please ask your pharmacy to contact our office. Our fax number is [phone].  If you have an urgent  issue when the clinic is closed that cannot wait until the next business day, you can page your doctor at the number below.    Please note that while we do our best to be available for urgent issues outside of office hours, we are not available 24/7.   If you have an urgent issue and are unable to reach [place], you may choose to seek medical care at your doctor's office, retail clinic, urgent care center, or emergency room.  If you have a medical emergency, please immediately call 911 or go to the emergency department.  Pager Numbers  - Dr. Gwen Pounds: [phone]  - Dr. Roseanne Reno: [phone]  - Dr. Katrinka Blazing: [phone]   In the event of inclement weather, please call our main line at [phone] for an update on the status of any delays or closures.  Dermatology Medication Tips: Please keep the boxes that topical medications come in in order to help keep track of  the instructions about where and how to use these. Pharmacies typically print the medication instructions only on the boxes and not directly on the medication tubes.   If your medication is too expensive, please contact our office at [phone] option 4 or send [place] a message through MyChart.   We are unable to tell what your co-pay for medications will be in advance as this is different depending on your insurance coverage. However, we may be able to find a substitute medication at lower cost or fill out paperwork to get insurance to cover a needed medication.   If a prior authorization is required to get your medication covered by your insurance company, please allow [place] 1-2 business days to complete this process.  Drug prices often vary depending on where the prescription is filled and some pharmacies may offer cheaper prices.  The website www.goodrx.com contains coupons for medications through different pharmacies. The prices here do not account for what the cost may be with help from insurance (it may be cheaper with your  insurance), but the website can give you the price if you did not use any insurance.  - You can print the associated coupon and take it with your prescription to the pharmacy.  - You may also stop by our office during regular business hours and pick up a GoodRx coupon card.  - If you need your prescription sent electronically to a different pharmacy, notify our office through [facility] or by phone at [phone] option 4.     Si Usted Necesita Algo Despus de Su Visita  Tambin puede enviarnos un mensaje a travs de Clinical cytogeneticist. Por lo general respondemos a los mensajes de MyChart en el transcurso de 1 a 2 das hbiles.  Para renovar recetas, por favor pida a su farmacia que se ponga en contacto con nuestra oficina. Annie Sable de fax es [place] [phone].  Si tiene un asunto urgente cuando la clnica est cerrada y que no puede esperar hasta el siguiente da hbil, puede llamar/localizar a su doctor(a) al nmero que aparece a continuacin.   Por favor, tenga en cuenta que aunque hacemos todo lo posible para estar disponibles para asuntos urgentes fuera del horario de [place], no estamos disponibles las 24 horas del da, los 7 [address]  [address] de la [place].   Si tiene un problema urgente y no puede comunicarse con nosotros, puede optar por buscar atencin mdica  en el consultorio de su doctor(a), en una clnica privada, en un centro de atencin urgente o en una sala de emergencias.  Si tiene Engineer, drilling, por favor llame inmediatamente al 911 o vaya a la sala de emergencias.  Nmeros de bper  - Dr. Gwen Pounds: [phone]  - Dra. Roseanne Reno: [phone]  - Dr. Katrinka Blazing: [phone]   En caso de inclemencias del tiempo, por favor llame a Lacy Duverney principal al [phone] para una actualizacin sobre el [place] de cualquier retraso o cierre.  Consejos para la medicacin en dermatologa: Por favor, guarde las cajas en las que vienen los medicamentos de uso tpico para ayudarle a  seguir las instrucciones sobre dnde y cmo usarlos. Las farmacias generalmente imprimen las instrucciones del medicamento slo en las cajas y no directamente en los tubos del [place].   Si su medicamento es muy caro, por favor, pngase en contacto con Rolm Gala llamando al [phone] y presione la opcin 4 o envenos un mensaje a travs de Clinical cytogeneticist.   No podemos decirle cul ser su copago por los medicamentos por  adelantado ya que esto es diferente dependiendo de la cobertura de su seguro. Sin embargo, es posible que podamos encontrar un medicamento sustituto a Audiological scientist un formulario para que el seguro cubra el medicamento que se considera necesario.   Si se requiere una autorizacin previa para que su compaa de seguros [place] su medicamento, por favor permtanos de 1 a 2 das hbiles para completar [address]  Los precios de los medicamentos varan con frecuencia dependiendo del Environmental consultant de dnde se surte la receta y alguna farmacias pueden ofrecer precios ms baratos.  El sitio web www.goodrx.com tiene cupones para medicamentos de Health and safety inspector. Los precios aqu no tienen en cuenta lo que podra costar con la ayuda del seguro (puede ser ms barato con su seguro), pero el sitio web puede darle el precio si no utiliz Tourist information centre manager.  - Puede imprimir el cupn correspondiente y llevarlo con su receta a la farmacia.  - Tambin puede pasar por nuestra oficina durante el horario de atencin regular y Education officer, museum una tarjeta de cupones de GoodRx.  - Si necesita que su receta se enve electrnicamente a una farmacia diferente, informe a nuestra oficina a travs de MyChart de [place] o por telfono llamando al [phone] y presione la opcin 4.

## 2023-08-14 ENCOUNTER — Encounter: Payer: Self-pay | Admitting: Dermatology

## 2023-10-31 ENCOUNTER — Ambulatory Visit: Payer: BC Managed Care – PPO | Admitting: Dermatology

## 2024-01-25 DIAGNOSIS — H04123 Dry eye syndrome of bilateral lacrimal glands: Secondary | ICD-10-CM | POA: Diagnosis not present

## 2024-01-25 DIAGNOSIS — H5203 Hypermetropia, bilateral: Secondary | ICD-10-CM | POA: Diagnosis not present

## 2024-04-03 ENCOUNTER — Ambulatory Visit: Payer: BC Managed Care – PPO | Admitting: Dermatology

## 2024-04-30 ENCOUNTER — Ambulatory Visit: Admitting: Dermatology

## 2024-05-12 ENCOUNTER — Ambulatory Visit: Admitting: Dermatology

## 2024-05-12 DIAGNOSIS — L578 Other skin changes due to chronic exposure to nonionizing radiation: Secondary | ICD-10-CM

## 2024-05-12 DIAGNOSIS — W908XXA Exposure to other nonionizing radiation, initial encounter: Secondary | ICD-10-CM | POA: Diagnosis not present

## 2024-05-12 DIAGNOSIS — Z1283 Encounter for screening for malignant neoplasm of skin: Secondary | ICD-10-CM

## 2024-05-12 DIAGNOSIS — D229 Melanocytic nevi, unspecified: Secondary | ICD-10-CM

## 2024-05-12 DIAGNOSIS — L814 Other melanin hyperpigmentation: Secondary | ICD-10-CM

## 2024-05-12 DIAGNOSIS — Z79899 Other long term (current) drug therapy: Secondary | ICD-10-CM

## 2024-05-12 DIAGNOSIS — I781 Nevus, non-neoplastic: Secondary | ICD-10-CM

## 2024-05-12 DIAGNOSIS — L304 Erythema intertrigo: Secondary | ICD-10-CM

## 2024-05-12 DIAGNOSIS — Z7189 Other specified counseling: Secondary | ICD-10-CM

## 2024-05-12 DIAGNOSIS — L570 Actinic keratosis: Secondary | ICD-10-CM

## 2024-05-12 DIAGNOSIS — L719 Rosacea, unspecified: Secondary | ICD-10-CM

## 2024-05-12 DIAGNOSIS — L739 Follicular disorder, unspecified: Secondary | ICD-10-CM

## 2024-05-12 MED ORDER — DOXYCYCLINE MONOHYDRATE 50 MG PO CAPS
ORAL_CAPSULE | ORAL | 3 refills | Status: DC
Start: 2024-05-12 — End: 2024-05-27

## 2024-05-12 NOTE — Progress Notes (Unsigned)
 Follow-Up Visit   Subjective  Roger Wagner is a 63 y.o. male who presents for the following: Skin Cancer Screening and Full Body Skin Exam hx of isks Hx of aks, multiple areas at scalp treated with Ln2 at last visit. Was given rx of  5 f/u calcipotriene cream to use at scalp in November 2024 reports noticed a slight pinkness and some peeling but did not get very inflamed.   Hx of rosacea, triple rosacea cream for [facility] and doxycycline  100 po qd when needed.   The patient presents for Total-Body Skin Exam (TBSE) for skin cancer screening and mole check. The patient has spots, moles and lesions to be evaluated, some may be new or changing and the patient may have concern these could be cancer.  The following portions of the chart were reviewed this encounter and updated as appropriate: medications, allergies, medical history  Review of Systems:  No other skin or systemic complaints except as noted in HPI or Assessment and Plan.  Objective  Well appearing patient in no apparent distress; mood and affect are within normal limits.  A full examination was performed including scalp, head, eyes, ears, nose, lips, neck, chest, axillae, abdomen, back, buttocks, bilateral upper extremities, bilateral lower extremities, hands, feet, fingers, toes, fingernails, and toenails. All findings within normal limits unless otherwise noted below.   Relevant physical exam findings are noted in the Assessment and Plan.  scalp x 1 Erythematous thin papules/macules with gritty scale.   Assessment & Plan   SKIN CANCER SCREENING PERFORMED TODAY.  ACTINIC DAMAGE - Chronic condition, secondary to cumulative UV/sun exposure - diffuse scaly erythematous macules with underlying dyspigmentation - Recommend daily broad spectrum sunscreen SPF 30+ to sun-exposed areas, reapply every 2 hours as needed.  - Staying in the shade or wearing long sleeves, sun glasses (UVA+UVB protection) and wide brim hats (4-inch  brim around the entire circumference of the hat) are also recommended for sun protection.  - Call for new or changing lesions.  LENTIGINES, SEBORRHEIC KERATOSES, HEMANGIOMAS - Benign normal skin lesions - Benign-appearing - Call for any changes  MELANOCYTIC NEVI - Tan-brown and/or pink-flesh-colored symmetric macules and papules - Benign appearing on exam today - Observation - Call clinic for new or changing moles - Recommend daily use of broad spectrum spf 30+ sunscreen to sun-exposed areas.   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 Condition is symptomatic/ bothersome to patient. Not currently at goal. Intertrigo is a chronic recurrent rash that occurs in skin fold areas that may be associated with friction; heat; moisture; yeast; fungus; and bacteria.  It is exacerbated by increased movement / activity; sweating; and higher atmospheric temperature.  Use of an absorbant powder such as Zeasorb AF powder or other OTC antifungal powder to the area daily can prevent rash recurrence. Other options to help keep the area dry include blow drying the area after bathing or using antiperspirant products such as Duradry sweat minimizing gel. Treatment Plan: Patient defers treatment at this time  ACTINIC KERATOSIS scalp x 1 Actinic keratoses are precancerous spots that appear secondary to cumulative UV radiation exposure/sun exposure over time. They are chronic with expected duration over 1 year. A portion of actinic keratoses will progress to squamous cell carcinoma of the skin. It is not possible to reliably predict which spots will progress to skin cancer and so treatment is recommended to prevent development of skin cancer.  Recommend  daily broad spectrum sunscreen SPF 30+ to sun-exposed areas, reapply every 2 hours as needed.  Recommend staying in the shade or wearing long sleeves, sun  glasses (UVA+UVB protection) and wide brim hats (4-inch brim around the entire circumference of the hat). Call for new or changing lesions. Destruction of lesion - scalp x 1 Complexity: simple   Destruction method: cryotherapy   Informed consent: discussed and consent obtained   Timeout:  patient name, date of birth, surgical site, and procedure verified Lesion destroyed using liquid nitrogen: Yes   Region frozen until ice Epling extended beyond lesion: Yes   Outcome: patient tolerated procedure well with no complications   Post-procedure details: wound care instructions given    ROSACEA  ROSACEA/ hx of Folliculitis hx of perioral dermatitis  Improved on skin medicinals triple cream and doxycycline  100 mg Exam Mid face erythema with telangiectasias  Chronic and persistent condition with duration or expected duration over one year. Condition is improving with treatment but not currently at goal. Rosacea is a chronic progressive skin condition usually affecting the face of adults, causing redness and/or acne bumps. It is treatable but not curable. It sometimes affects the eyes (ocular rosacea) as well. It may respond to topical and/or systemic medication and can flare with stress, sun exposure, alcohol, exercise, topical steroids (including hydrocortisone/cortisone 10) and some foods.  Daily application of broad spectrum spf 30+ sunscreen to face is recommended to reduce flares.  Patient denies grittiness of the eyes   Treatment Plan Continue Skin medicinals Rosacea triple cream qd-bid for rosacea  Continue Doxycycline  100 mg take 1 tablet daily    Doxycycline  should be taken with food to prevent nausea. Do not lay down for 30 minutes after taking. Be cautious with sun exposure and use good sun protection while on this medication. Pregnant women should not take this medication. Related Medications Ivermectin  1 % CREA Apply to face qd-bid doxycycline  (MONODOX ) 50 MG capsule 50 mg po qd  with food for rosacea flares Return in about 1 year (around 05/12/2025) for TBSE.  IEleanor Blush, CMA, am acting as scribe for Alm Rhyme, MD.   Documentation: I have reviewed the above documentation for accuracy and completeness, and I agree with the above.  Alm Rhyme, MD

## 2024-05-12 NOTE — Patient Instructions (Addendum)
Actinic keratoses are precancerous spots that appear secondary to cumulative UV radiation exposure/sun exposure over time. They are chronic with expected duration over 1 year. A portion of actinic keratoses will progress to squamous cell carcinoma of the skin. It is not possible to reliably predict which spots will progress to skin cancer and so treatment is recommended to prevent development of skin cancer.  Recommend daily broad spectrum sunscreen SPF 30+ to sun-exposed areas, reapply every 2 hours as needed.  Recommend staying in the shade or wearing long sleeves, sun glasses (UVA+UVB protection) and wide brim hats (4-inch brim around the entire circumference of the hat). Call for new or changing lesions.    Cryotherapy Aftercare  Wash gently with soap and water everyday.   Apply Vaseline and Band-Aid daily until healed.     Melanoma ABCDEs  Melanoma is the most dangerous type of skin cancer, and is the leading cause of death from skin disease.  You are more likely to develop melanoma if you: Have light-colored skin, light-colored eyes, or red or blond hair Spend a lot of time in the sun Tan regularly, either outdoors or in a tanning bed Have had blistering sunburns, especially during childhood Have a close family member who has had a melanoma Have atypical moles or large birthmarks  Early detection of melanoma is key since treatment is typically straightforward and cure rates are extremely high if we catch it early.   The first sign of melanoma is often a change in a mole or a new dark spot.  The ABCDE system is a way of remembering the signs of melanoma.  A for asymmetry:  The two halves do not match. B for border:  The edges of the growth are irregular. C for color:  A mixture of colors are present instead of an even brown color. D for diameter:  Melanomas are usually (but not always) greater than 6mm - the size of a pencil eraser. E for evolution:  The spot keeps changing in  size, shape, and color.  Please check your skin once per month between visits. You can use a small mirror in front and a large mirror behind you to keep an eye on the back side or your body.   If you see any new or changing lesions before your next follow-up, please call to schedule a visit.  Please continue daily skin protection including broad spectrum sunscreen SPF 30+ to sun-exposed areas, reapplying every 2 hours as needed when you're outdoors.   Staying in the shade or wearing long sleeves, sun glasses (UVA+UVB protection) and wide brim hats (4-inch brim around the entire circumference of the hat) are also recommended for sun protection.    Due to recent changes in healthcare laws, you may see results of your pathology and/or laboratory studies on MyChart before the doctors have had a chance to review them. We understand that in some cases there may be results that are confusing or concerning to you. Please understand that not all results are received at the same time and often the doctors may need to interpret multiple results in order to provide you with the best plan of care or course of treatment. Therefore, we ask that you please give [place] 2 business days to thoroughly review all your results before contacting the office for clarification. Should we see a critical lab result, you will be contacted sooner.   If You Need Anything After Your Visit  If you have any questions or concerns for  your doctor, please call our main line at [phone] and press option 4 to reach your doctor's medical assistant. If no one answers, please leave a voicemail as directed and we will return your call as soon as possible. Messages left after 4 pm will be answered the following business day.   You may also send [place] a message via MyChart. We typically respond to MyChart messages within 1-2 business days.  For prescription refills, please ask your pharmacy to contact our office. Our fax number is  [phone].  If you have an urgent issue when the clinic is closed that cannot wait until the next business day, you can page your doctor at the number below.    Please note that while we do our best to be available for urgent issues outside of office hours, we are not available 24/7.   If you have an urgent issue and are unable to reach [place], you may choose to seek medical care at your doctor's office, retail clinic, urgent care center, or emergency room.  If you have a medical emergency, please immediately call 911 or go to the emergency department.  Pager Numbers  - Dr. Gwen Pounds: [phone]  - Dr. Roseanne Reno: [phone]  - Dr. Katrinka Blazing: [phone]   In the event of inclement weather, please call our main line at [phone] for an update on the status of any delays or closures.  Dermatology Medication Tips: Please keep the boxes that topical medications come in in order to help keep track of the instructions about where and how to use these. Pharmacies typically print the medication instructions only on the boxes and not directly on the medication tubes.   If your medication is too expensive, please contact our office at [phone] option 4 or send [place] a message through MyChart.   We are unable to tell what your co-pay for medications will be in advance as this is different depending on your insurance coverage. However, we may be able to find a substitute medication at lower cost or fill out paperwork to get insurance to cover a needed medication.   If a prior authorization is required to get your medication covered by your insurance company, please allow [place] 1-2 business days to complete this process.  Drug prices often vary depending on where the prescription is filled and some pharmacies may offer cheaper prices.  The website www.goodrx.com contains coupons for medications through different pharmacies. The prices here do not account for what the cost may be with help from  insurance (it may be cheaper with your insurance), but the website can give you the price if you did not use any insurance.  - You can print the associated coupon and take it with your prescription to the pharmacy.  - You may also stop by our office during regular business hours and pick up a GoodRx coupon card.  - If you need your prescription sent electronically to a different pharmacy, notify our office through [facility] or by phone at [phone] option 4.     Si Usted Necesita Algo Despus de Su Visita  Tambin puede enviarnos un mensaje a travs de Clinical cytogeneticist. Por lo general respondemos a los mensajes de MyChart en el transcurso de 1 a 2 das hbiles.  Para renovar recetas, por favor pida a su farmacia que se ponga en contacto con nuestra oficina. Annie Sable de fax es [place] [phone].  Si tiene un asunto urgente cuando la clnica est cerrada y que no puede esperar Teacher, adult education  el siguiente da hbil, puede llamar/localizar a su doctor(a) al nmero que aparece a continuacin.   Por favor, tenga en cuenta que aunque hacemos todo lo posible para estar disponibles para asuntos urgentes fuera del horario de [place], no estamos disponibles las 24 horas del da, los 7 [address]  [address] de la [place].   Si tiene un problema urgente y no puede comunicarse con nosotros, puede optar por buscar atencin mdica  en el consultorio de su doctor(a), en una clnica privada, en un centro de atencin urgente o en una sala de emergencias.  Si tiene Engineer, drilling, por favor llame inmediatamente al 911 o vaya a la sala de emergencias.  Nmeros de bper  - Dr. Gwen Pounds: [phone]  - Dra. Roseanne Reno: [phone]  - Dr. Katrinka Blazing: [phone]   En caso de inclemencias del tiempo, por favor llame a Lacy Duverney principal al [phone] para una actualizacin sobre el [place] de cualquier retraso o cierre.  Consejos para la medicacin en dermatologa: Por favor, guarde las cajas en las que vienen los  medicamentos de uso tpico para ayudarle a seguir las instrucciones sobre dnde y cmo usarlos. Las farmacias generalmente imprimen las instrucciones del medicamento slo en las cajas y no directamente en los tubos del [place].   Si su medicamento es muy caro, por favor, pngase en contacto con Rolm Gala llamando al [phone] y presione la opcin 4 o envenos un mensaje a travs de Clinical cytogeneticist.   No podemos decirle cul ser su copago por los medicamentos por adelantado ya que esto es diferente dependiendo de la cobertura de su seguro. Sin embargo, es posible que podamos encontrar un medicamento sustituto a Audiological scientist un formulario para que el seguro cubra el medicamento que se considera necesario.   Si se requiere una autorizacin previa para que su compaa de seguros [place] su medicamento, por favor permtanos de 1 a 2 das hbiles para completar [address]  Los precios de los medicamentos varan con frecuencia dependiendo del Environmental consultant de dnde se surte la receta y alguna farmacias pueden ofrecer precios ms baratos.  El sitio web www.goodrx.com tiene cupones para medicamentos de Health and safety inspector. Los precios aqu no tienen en cuenta lo que podra costar con la ayuda del seguro (puede ser ms barato con su seguro), pero el sitio web puede darle el precio si no utiliz Tourist information centre manager.  - Puede imprimir el cupn correspondiente y llevarlo con su receta a la farmacia.  - Tambin puede pasar por nuestra oficina durante el horario de atencin regular y Education officer, museum una tarjeta de cupones de GoodRx.  - Si necesita que su receta se enve electrnicamente a una farmacia diferente, informe a nuestra oficina a travs de MyChart de [place] o por telfono llamando al [phone] y presione la opcin 4.

## 2024-05-13 ENCOUNTER — Encounter: Payer: Self-pay | Admitting: Dermatology

## 2024-05-27 ENCOUNTER — Encounter: Payer: Self-pay | Admitting: Internal Medicine

## 2024-05-27 ENCOUNTER — Ambulatory Visit

## 2024-05-27 ENCOUNTER — Ambulatory Visit: Payer: Self-pay | Admitting: Internal Medicine

## 2024-05-27 ENCOUNTER — Ambulatory Visit: Admitting: Internal Medicine

## 2024-05-27 VITALS — BP 142/82 | HR 65 | Temp 98.2°F | Resp 16 | Ht 72.0 in | Wt 213.0 lb

## 2024-05-27 DIAGNOSIS — M87076 Idiopathic aseptic necrosis of unspecified foot: Secondary | ICD-10-CM | POA: Diagnosis not present

## 2024-05-27 DIAGNOSIS — M19011 Primary osteoarthritis, right shoulder: Secondary | ICD-10-CM | POA: Diagnosis not present

## 2024-05-27 DIAGNOSIS — M25511 Pain in right shoulder: Secondary | ICD-10-CM

## 2024-05-27 DIAGNOSIS — Z0001 Encounter for general adult medical examination with abnormal findings: Secondary | ICD-10-CM | POA: Insufficient documentation

## 2024-05-27 DIAGNOSIS — M79671 Pain in right foot: Secondary | ICD-10-CM

## 2024-05-27 DIAGNOSIS — L719 Rosacea, unspecified: Secondary | ICD-10-CM | POA: Insufficient documentation

## 2024-05-27 DIAGNOSIS — E785 Hyperlipidemia, unspecified: Secondary | ICD-10-CM

## 2024-05-27 DIAGNOSIS — I10 Essential (primary) hypertension: Secondary | ICD-10-CM

## 2024-05-27 DIAGNOSIS — G8929 Other chronic pain: Secondary | ICD-10-CM

## 2024-05-27 LAB — LIPID PANEL
Cholesterol: 196 mg/dL (ref 0–200)
HDL: 44.7 mg/dL (ref >39.00)
LDL Cholesterol: 108 mg/dL — ABNORMAL HIGH (ref 0–99)
NonHDL: 151.3
Total CHOL/HDL Ratio: 4
Triglycerides: 216 mg/dL — ABNORMAL HIGH (ref 0.0–149.0)
VLDL: 43.2 mg/dL — ABNORMAL HIGH (ref 0.0–40.0)

## 2024-05-27 LAB — HEPATIC FUNCTION PANEL
ALT: 19 U/L (ref 0–53)
AST: 20 U/L (ref 0–37)
Albumin: 4.6 g/dL (ref 3.5–5.2)
Alkaline Phosphatase: 83 U/L (ref 39–117)
Bilirubin, Direct: 0.2 mg/dL (ref 0.0–0.3)
Total Bilirubin: 0.8 mg/dL (ref 0.2–1.2)
Total Protein: 7.2 g/dL (ref 6.0–8.3)

## 2024-05-27 LAB — CBC WITH DIFFERENTIAL/PLATELET
Basophils Absolute: 0 K/uL (ref 0.0–0.1)
Basophils Relative: 0.5 % (ref 0.0–3.0)
Eosinophils Absolute: 0.1 K/uL (ref 0.0–0.7)
Eosinophils Relative: 1 % (ref 0.0–5.0)
HCT: 39.9 % (ref 39.0–52.0)
Hemoglobin: 13.4 g/dL (ref 13.0–17.0)
Lymphocytes Relative: 26.5 % (ref 12.0–46.0)
Lymphs Abs: 1.9 K/uL (ref 0.7–4.0)
MCHC: 33.6 g/dL (ref 30.0–36.0)
MCV: 84.7 fl (ref 78.0–100.0)
Monocytes Absolute: 0.5 K/uL (ref 0.1–1.0)
Monocytes Relative: 7 % (ref 3.0–12.0)
Neutro Abs: 4.6 K/uL (ref 1.4–7.7)
Neutrophils Relative %: 65 % (ref 43.0–77.0)
Platelets: 297 K/uL (ref 150.0–400.0)
RBC: 4.71 Mil/uL (ref 4.22–5.81)
RDW: 13.3 % (ref 11.5–15.5)
WBC: 7 K/uL (ref 4.0–10.5)

## 2024-05-27 LAB — BASIC METABOLIC PANEL WITH GFR
BUN: 16 mg/dL (ref 6–23)
CO2: 30 meq/L (ref 19–32)
Calcium: 9.2 mg/dL (ref 8.4–10.5)
Chloride: 100 meq/L (ref 96–112)
Creatinine, Ser: 0.97 mg/dL (ref 0.40–1.50)
GFR: 82.83 mL/min (ref >60.00)
Glucose, Bld: 89 mg/dL (ref 70–99)
Potassium: 3.9 meq/L (ref 3.5–5.1)
Sodium: 138 meq/L (ref 135–145)

## 2024-05-27 LAB — TSH: TSH: 1.7 u[IU]/mL (ref 0.35–5.50)

## 2024-05-27 LAB — PSA: PSA: 2.37 ng/mL (ref 0.10–4.00)

## 2024-05-27 NOTE — Progress Notes (Addendum)
 Subjective:  Patient ID: Roger Wagner, male    DOB: 05-14-1961  Age: 63 y.o. MRN: [id number]  CC: Annual Exam and Hypertension   HPI Roger Wagner presents for a CPX and f/up ----  Discussed the use of AI scribe software for clinical note transcription with the patient, who gave verbal consent to proceed.  History of Present Illness Roger Wagner is a 62 year old male who presents with shoulder and foot pain.  He has been experiencing shoulder pain for a couple of months, which is particularly bothersome at night and affects his sleep as he cannot lay on it. The pain started after playing softball, an activity he had not engaged in for 10-15 years. Despite no recent injury, he continues to use the arm regularly at work and while playing with his son. The pain has shown slight improvement, but persists despite Tylenol  use, which does not alleviate the shoulder pain. Range of motion was difficult a couple of weeks ago but has since improved.  Regarding his foot, he describes pain that occurs when he sits for a while and then stands up, with initial stiffness that loosens up over time but remains sore. This has been ongoing for about six months, with occasional swelling noted. He denies any recent injury to the foot. Tylenol  provides some relief for the foot pain.  He remains active, performing push-ups and sit-ups every morning, working full-time in a physically demanding job, and engaging in activities such as playing with his granddaughter and mowing the yard. No chest pain, shortness of breath, dizziness, or lightheadedness.    Outpatient Medications Prior to Visit  Medication Sig Dispense Refill   doxycycline  (MONODOX ) 50 MG capsule Take 50 mg by mouth as needed.     Multiple Vitamin (MULTIVITAMIN WITH MINERALS) TABS tablet Take 1 tablet by mouth daily.     doxycycline  (MONODOX ) 50 MG capsule 50 mg po qd with food for rosacea flares 90 capsule 3   Ivermectin  1 % CREA  Apply to face qd-bid (Patient not taking: Reported on 04/27/2023) 45 g 6   neomycin -polymyxin b -dexamethasone (MAXITROL) 3.5-10000-0.1 SUSP Place 2 drops into both eyes every 6 (six) hours. (Patient not taking: Reported on 04/27/2023) 5 mL 0   No facility-administered medications prior to visit.    ROS Review of Systems  Constitutional:  Negative for appetite change, chills, diaphoresis, fatigue and fever.  HENT: Negative.    Eyes: Negative.   Respiratory: Negative.  Negative for cough, chest tightness, shortness of breath and wheezing.   Cardiovascular:  Negative for chest pain, palpitations and leg swelling.  Gastrointestinal: Negative.  Negative for abdominal pain, blood in stool, constipation, diarrhea, nausea and vomiting.  Endocrine: Negative.   Genitourinary: Negative.  Negative for difficulty urinating and dysuria.  Musculoskeletal:  Positive for arthralgias. Negative for back pain and myalgias.  Skin:  Negative for color change and rash.  Neurological: Negative.  Negative for dizziness, weakness, light-headedness and headaches.  Hematological:  Negative for adenopathy. Does not bruise/bleed easily.  Psychiatric/Behavioral: Negative.      Objective:  BP (!) 142/82 (BP Location: Left Arm, Patient Position: Sitting, Cuff Size: Normal) Comment: BP (R) 142/86  Pulse 65   Temp 98.2 F (36.8 C) (Oral)   Resp 16   Ht 6' (1.829 m)   Wt 213 lb (96.6 kg)   SpO2 98%   BMI 28.89 kg/m   BP Readings from Last 3 Encounters:  05/27/24 (!) 142/82  05/15/23 117/68  05/25/21 126/84    Wt Readings from Last 3 Encounters:  05/27/24 213 lb (96.6 kg)  05/15/23 210 lb (95.3 kg)  04/27/23 210 lb (95.3 kg)    Physical Exam Vitals reviewed. Exam conducted with a chaperone present.  Constitutional:      Appearance: Normal appearance.  HENT:     Nose: Nose normal.     Mouth/Throat:     Mouth: Mucous membranes are moist.  Eyes:     General: No scleral icterus.    Conjunctiva/sclera:  Conjunctivae normal.  Cardiovascular:     Rate and Rhythm: Normal rate and regular rhythm.     Heart sounds: Normal heart sounds, S1 normal and S2 normal. No murmur heard.    No friction rub. No gallop.     Comments: EKG--- NSR, 63 bpm No LVH, Q waves, or ST/T wave changes  Pulmonary:     Effort: Pulmonary effort is normal.     Breath sounds: No stridor. No wheezing, rhonchi or rales.  Abdominal:     General: Abdomen is flat. There is no distension.     Palpations: There is no mass.     Tenderness: There is no abdominal tenderness. There is no guarding.     Hernia: No hernia is present. There is no hernia in the left inguinal area or right inguinal area.  Genitourinary:    Pubic Area: No rash.      Penis: Normal and circumcised.      Testes: Normal.     Epididymis:     Right: Normal.     Left: Normal.     Prostate: Normal. Not enlarged, not tender and no nodules present.     Rectum: Normal. Guaiac result negative. No mass, tenderness, anal fissure, external hemorrhoid or internal hemorrhoid. Normal anal tone.  Musculoskeletal:     Cervical back: Neck supple.     Right lower leg: No edema.     Left lower leg: No edema.  Lymphadenopathy:     Lower Body: No right inguinal adenopathy. No left inguinal adenopathy.  Skin:    General: Skin is warm and dry.  Neurological:     General: No focal deficit present.     Mental Status: He is alert.  Psychiatric:        Mood and Affect: Mood normal.        Behavior: Behavior normal.     Lab Results  Component Value Date   WBC 7.0 05/27/2024   HGB 13.4 05/27/2024   HCT 39.9 05/27/2024   PLT 297.0 05/27/2024   GLUCOSE 89 05/27/2024   CHOL 196 05/27/2024   TRIG 216.0 (H) 05/27/2024   HDL 44.70 05/27/2024   LDLDIRECT 120.0 05/25/2021   LDLCALC 108 (H) 05/27/2024   ALT 19 05/27/2024   AST 20 05/27/2024   NA 138 05/27/2024   K 3.9 05/27/2024   CL 100 05/27/2024   CREATININE 0.97 05/27/2024   BUN 16 05/27/2024   CO2 30  05/27/2024   TSH 1.70 05/27/2024   PSA 2.37 05/27/2024   HGBA1C 5.6 05/25/2021   DG Foot Complete Right Result Date: 05/27/2024 CLINICAL DATA:  Right foot pain for more than 1 year. No known injury. EXAM: RIGHT FOOT COMPLETE - 3+ VIEW COMPARISON:  None Available. FINDINGS: Dorsal tarsal beak. Marked talonavicular degenerative changes with subarticular sclerosis and cystic changes on both sides of the joint space. There is also a narrow AP diameter of the navicular with no fracture lines visible. Moderate inferior calcaneal enthesophyte  formation. No fracture or dislocation seen. IMPRESSION: 1. Marked talonavicular degenerative changes. 2. Narrow AP diameter of the navicular with no fracture lines visible. This could be due to an old fracture or avascular necrosis. 3. Dorsal tarsal beak.  This can be seen with tarsal coalition. 4. Moderate inferior calcaneal enthesophyte formation. Electronically Signed   By: Elspeth Bathe M.D.   On: 05/27/2024 16:10   DG Shoulder Right Result Date: 05/27/2024 CLINICAL DATA:  Right shoulder pain.  No known injury. EXAM: RIGHT SHOULDER - 2+ VIEW COMPARISON:  None Available. FINDINGS: Degenerative changes in the [facility] joint with joint space narrowing and spurring. Glenohumeral joint is maintained. No acute bony abnormality. Specifically, no fracture, subluxation, or dislocation. Soft tissues are intact. IMPRESSION: Degenerative changes in the right AC joint. No acute bony abnormality. Electronically Signed   By: Franky Crease M.D.   On: 05/27/2024 16:08       Assessment & Plan:   Encounter for general adult medical examination with abnormal findings- Exam completed, labs reviewed, vaccines reviewed and updated, cancer screenings addressed, pt ed material was given.  -     Lipid panel; Future -     PSA; Future  Chronic right shoulder pain -     DG Shoulder Right; Future  Right foot pain -     DG Foot Complete Right; Future  Primary hypertension- Will start an ARB.  EKG is negative for LVH. -     Urinalysis, Routine w reflex microscopic; Future -     TSH; Future -     Hepatic function panel; Future -     Basic metabolic panel with GFR; Future -     CBC with Differential/Platelet; Future -     EKG 12-Lead -     Olmesartan  Medoxomil; Take 1 tablet (20 mg total) by mouth daily.  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 -     Rosuvastatin  Calcium ; Take 1 tablet (10 mg total) by mouth daily.  Dispense: 90 tablet; Refill: 1 -     AMB Referral [facility] Care Management -     CT CARDIAC SCORING ([facility] LOCATIONS ONLY); Future     Follow-up: Return in about 6 months (around 11/27/2024).  Debby Molt, MD

## 2024-05-27 NOTE — Patient Instructions (Signed)
 Hypertension, Adult High blood pressure (hypertension) is when the force of blood pumping through the arteries is too strong. The arteries are the blood vessels that carry blood from the heart throughout the body. Hypertension forces the heart to work harder to pump blood and may cause arteries to become narrow or stiff. Untreated or uncontrolled hypertension can lead to a heart attack, heart failure, a stroke, kidney disease, and other problems. A blood pressure reading consists of a higher number over a lower number. Ideally, your blood pressure should be below 120/80. The first ("top") number is called the systolic pressure. It is a measure of the pressure in your arteries as your heart beats. The second ("bottom") number is called the diastolic pressure. It is a measure of the pressure in your arteries as the heart relaxes. What are the causes? The exact cause of this condition is not known. There are some conditions that result in high blood pressure. What increases the risk? Certain factors may make you more likely to develop high blood pressure. Some of these risk factors are under your control, including: Smoking. Not getting enough exercise or physical activity. Being overweight. Having too much fat, sugar, calories, or salt (sodium) in your diet. Drinking too much alcohol. Other risk factors include: Having a personal history of heart disease, diabetes, high cholesterol, or kidney disease. Stress. Having a family history of high blood pressure and high cholesterol. Having obstructive sleep apnea. Age. The risk increases with age. What are the signs or symptoms? High blood pressure may not cause symptoms. Very high blood pressure (hypertensive crisis) may cause: Headache. Fast or irregular heartbeats (palpitations). Shortness of breath. Nosebleed. Nausea and vomiting. Vision changes. Severe chest pain, dizziness, and seizures. How is this diagnosed? This condition is diagnosed by  measuring your blood pressure while you are seated, with your arm resting on a flat surface, your legs uncrossed, and your feet flat on the floor. The cuff of the blood pressure monitor will be placed directly against the skin of your upper arm at the level of your heart. Blood pressure should be measured at least twice using the same arm. Certain conditions can cause a difference in blood pressure between your right and left arms. If you have a high blood pressure reading during one visit or you have normal blood pressure with other risk factors, you may be asked to: Return on a different day to have your blood pressure checked again. Monitor your blood pressure at home for 1 week or longer. If you are diagnosed with hypertension, you may have other blood or imaging tests to help your health care provider understand your overall risk for other conditions. How is this treated? This condition is treated by making healthy lifestyle changes, such as eating healthy foods, exercising more, and reducing your alcohol intake. You may be referred for counseling on a healthy diet and physical activity. Your health care provider may prescribe medicine if lifestyle changes are not enough to get your blood pressure under control and if: Your systolic blood pressure is above 130. Your diastolic blood pressure is above 80. Your personal target blood pressure may vary depending on your medical conditions, your age, and other factors. Follow these instructions at home: Eating and drinking  Eat a diet that is high in fiber and potassium, and low in sodium, added sugar, and fat. An example of this eating plan is called the DASH diet. DASH stands for Dietary Approaches to Stop Hypertension. To eat this way: Eat  plenty of fresh fruits and vegetables. Try to fill one half of your plate at each meal with fruits and vegetables. Eat whole grains, such as whole-wheat pasta, brown rice, or whole-grain bread. Fill about one  fourth of your plate with whole grains. Eat or drink low-fat dairy products, such as skim milk or low-fat yogurt. Avoid fatty cuts of meat, processed or cured meats, and poultry with skin. Fill about one fourth of your plate with lean proteins, such as fish, chicken without skin, beans, eggs, or tofu. Avoid pre-made and processed foods. These tend to be higher in sodium, added sugar, and fat. Reduce your daily sodium intake. Many people with hypertension should eat less than 1,500 mg of sodium a day. Do not drink alcohol if: Your health care provider tells you not to drink. You are pregnant, may be pregnant, or are planning to become pregnant. If you drink alcohol: Limit how much you have to: 0-1 drink a day for women. 0-2 drinks a day for men. Know how much alcohol is in your drink. In the [place] one drink equals one 12 oz bottle of beer (355 mL), one 5 oz glass of wine (148 mL), or one 1 oz glass of hard liquor (44 mL). Lifestyle  Work with your health care provider to maintain a healthy body weight or to lose weight. Ask what an ideal weight is for you. Get at least 30 minutes of exercise that causes your heart to beat faster (aerobic exercise) most days of the week. Activities may include walking, swimming, or biking. Include exercise to strengthen your muscles (resistance exercise), such as Pilates or lifting weights, as part of your weekly exercise routine. Try to do these types of exercises for 30 minutes at least 3 days a week. Do not use any products that contain nicotine or tobacco. These products include cigarettes, chewing tobacco, and vaping devices, such as e-cigarettes. If you need help quitting, ask your health care provider. Monitor your blood pressure at home as told by your health care provider. Keep all follow-up visits. This is important. Medicines Take over-the-counter and prescription medicines only as told by your health care provider. Follow directions carefully. Blood  pressure medicines must be taken as prescribed. Do not skip doses of blood pressure medicine. Doing this puts you at risk for problems and can make the medicine less effective. Ask your health care provider about side effects or reactions to medicines that you should watch for. Contact a health care provider if you: Think you are having a reaction to a medicine you are taking. Have headaches that keep coming back (recurring). Feel dizzy. Have swelling in your ankles. Have trouble with your vision. Get help right away if you: Develop a severe headache or confusion. Have unusual weakness or numbness. Feel faint. Have severe pain in your chest or abdomen. Vomit repeatedly. Have trouble breathing. These symptoms may be an emergency. Get help right away. Call 911. Do not wait to see if the symptoms will go away. Do not drive yourself to the hospital. Summary Hypertension is when the force of blood pumping through your arteries is too strong. If this condition is not controlled, it may put you at risk for serious complications. Your personal target blood pressure may vary depending on your medical conditions, your age, and other factors. For most people, a normal blood pressure is less than 120/80. Hypertension is treated with lifestyle changes, medicines, or a combination of both. Lifestyle changes include losing weight, eating a healthy,  low-sodium diet, exercising more, and limiting alcohol. This information is not intended to replace advice given to you by your health care provider. Make sure you discuss any questions you have with your health care provider. Document Revised: 08/09/2021 Document Reviewed: 08/09/2021 Elsevier Patient Education  2024 [employer].

## 2024-05-28 ENCOUNTER — Encounter: Payer: Self-pay | Admitting: Internal Medicine

## 2024-05-28 DIAGNOSIS — E785 Hyperlipidemia, unspecified: Secondary | ICD-10-CM | POA: Insufficient documentation

## 2024-05-28 LAB — URINALYSIS, ROUTINE W REFLEX MICROSCOPIC
Bilirubin Urine: NEGATIVE
Hgb urine dipstick: NEGATIVE
Ketones, ur: NEGATIVE
Leukocytes,Ua: NEGATIVE
Nitrite: NEGATIVE
Specific Gravity, Urine: 1.02 (ref 1.000–1.030)
Total Protein, Urine: NEGATIVE
Urine Glucose: NEGATIVE
Urobilinogen, UA: 0.2 (ref 0.0–1.0)
pH: 6 (ref 5.0–8.0)

## 2024-05-28 MED ORDER — OLMESARTAN MEDOXOMIL 20 MG PO TABS: 20.0000 mg | ORAL_TABLET | Freq: Every day | ORAL | 1 refills | Status: AC

## 2024-05-28 MED ORDER — ROSUVASTATIN CALCIUM 10 MG PO TABS
10.0000 mg | ORAL_TABLET | Freq: Every day | ORAL | 1 refills | Status: AC
Start: 2024-05-28 — End: ?

## 2024-06-03 ENCOUNTER — Other Ambulatory Visit: Payer: Self-pay | Admitting: Internal Medicine

## 2024-06-11 ENCOUNTER — Telehealth: Payer: Self-pay | Admitting: *Deleted

## 2024-06-11 NOTE — Progress Notes (Signed)
 Care Guide Pharmacy Note  06/11/2024 Name: Roger Wagner MRN: [id number] DOB: 06-Jul-1961  Referred By: Joshua Debby CROME, MD Reason for referral: Complex Care Management (Outreach to schedule referral with pharmacist )   Roger Wagner is a 63 y.o. year old male who is a primary care patient of Joshua Debby CROME, MD.  Roger Wagner was referred to the pharmacist for assistance related to: HTN  Successful contact was made with the patient to discuss pharmacy services.  Patient declines engagement at this time. Contact information was provided to the patient should they wish to reach out for assistance at a later time.  Roger Wagner, CMA [place]  [facility], [facility] Guide Direct Dial: [phone]  Fax: [phone] Website: [email or web address]

## 2024-08-29 ENCOUNTER — Ambulatory Visit (INDEPENDENT_AMBULATORY_CARE_PROVIDER_SITE_OTHER): Admitting: Physician Assistant

## 2024-08-29 DIAGNOSIS — M19011 Primary osteoarthritis, right shoulder: Secondary | ICD-10-CM

## 2024-08-29 DIAGNOSIS — M19079 Primary osteoarthritis, unspecified ankle and foot: Secondary | ICD-10-CM

## 2024-08-29 NOTE — Progress Notes (Signed)
 Office Visit Note   Patient: Roger Wagner           Date of Birth: 1961/03/05           MRN: [id number] Visit Date: 08/29/2024              Requested by: Joshua Debby CROME, MD [address] [place],  [place] [address] PCP: Joshua Debby CROME, MD  Chief Complaint  Patient presents with   Right Foot - Pain   Right Shoulder - Pain      HPI: 63 y/o male with right foot and ankle pain.  The right foot pain has gotten progressively painful over the last 6 months.  He denies recent injury, but he did fracture his foot more than 30 years ago.      Assessment & Plan: Visit Diagnoses:  1. Arthritis of foot, unspecified laterality   2. Arthritis of right acromioclavicular joint     Plan: I gave him an order for custom orthotics to support the arch and decrease the arthritic pain he is having.  If his pain does not improve he will see Dr. Harden to discuss possible surgical fusion to decrease motion in the arthritic joints.  He will continue shoulder strengthening exercises that seem to be improving his should pain.  If he has return of pain that does not get better he will call us  and we can get an MRI of the shoulder.    Follow-Up Instructions: Return if symptoms worsen or fail to improve.   Ortho Exam  Patient is alert, oriented, no adenopathy, well-dressed, normal affect, normal respiratory effort. Right foot pain in the navicular area with palpable dorsal talar spur.  Subtalar motion in intact.  Active ankle flexion and plantar flexion are intact and do not cause pain.   His heels invert with toe raises.  When asked where the pain is her points to the medial arch and dorsal mid foot.  Right > left.    Right shoulder with tenderness to palpation along the [facility] joint.  Abduction and eternal rotation above 90 degrees causes some discomfort.  He has been doing exercises an d his shoulder pain is improving.     Imaging: Reviewed the x rays from 05/27/24.   FINDINGS: Dorsal tarsal beak.  Marked talonavicular degenerative changes with subarticular sclerosis and cystic changes on both sides of the joint space. There is also a narrow AP diameter of the navicular with no fracture lines visible. Moderate inferior calcaneal enthesophyte formation. No fracture or dislocation seen.   IMPRESSION: 1. Marked talonavicular degenerative changes. 2. Narrow AP diameter of the navicular with no fracture lines visible. This could be due to an old fracture or avascular necrosis. 3. Dorsal tarsal beak.  This can be seen with tarsal coalition. 4. Moderate inferior calcaneal enthesophyte formation.  Labs: Lab Results  Component Value Date   HGBA1C 5.6 05/25/2021   HGBA1C 5.4 09/29/2019   HGBA1C 5.3 08/07/2017   ESRSEDRATE 11 03/21/2013     Lab Results  Component Value Date   ALBUMIN 4.6 05/27/2024   ALBUMIN 4.5 08/07/2017   ALBUMIN 4.5 01/24/2016    No results found for: MG No results found for: VD25OH  No results found for: PREALBUMIN    Latest Ref Rng & Units 05/27/2024    4:00 PM 01/24/2016    9:04 AM 08/13/2014    1:49 PM  CBC EXTENDED  WBC 4.0 - 10.5 K/uL 7.0  4.7  5.4   RBC  4.22 - 5.81 Mil/uL 4.71  4.66  4.73   Hemoglobin 13.0 - 17.0 g/dL 86.5  86.2  86.0   HCT 39.0 - 52.0 % 39.9  39.6  41.6   Platelets 150.0 - 400.0 K/uL 297.0  295.0  312.0   NEUT# 1.4 - 7.7 K/uL 4.6  2.7  3.7   Lymph# 0.7 - 4.0 K/uL 1.9  1.3  1.3      There is no height or weight on file to calculate BMI.  Orders:  No orders of the defined types were placed in this encounter.  No orders of the defined types were placed in this encounter.    Procedures: No procedures performed  Clinical Data: No additional findings.  ROS:  All other systems negative, except as noted in the HPI. Review of Systems  Objective: Vital Signs: There were no vitals taken for this visit.  Specialty Comments:  No specialty comments available.  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) 05/27/2024   Arthritis of right acromioclavicular joint 05/27/2024   GAD (generalized anxiety disorder) 01/24/2016   Acne cystica 08/23/2014   DJD (degenerative joint disease) of knee 03/21/2013   Past Medical History:  Diagnosis Date   Allergy    mild - no medicines    Anxiety    Arthritis    left wrist   Chronic kidney disease    kidney stones   Complication of anesthesia    hard to wake up   History of kidney stones 2015   Neuromuscular disorder (HCC)    hx carpal tunnel left     Family History  Problem Relation Age of Onset   Stroke Father    Colon cancer Father    Cancer Other        Colon Cancer   Diabetes Other    Stroke Other    Colon polyps Neg Hx    Rectal cancer Neg Hx    Stomach cancer Neg Hx    Esophageal cancer Neg Hx     Past Surgical History:  Procedure Laterality Date   CARPAL TUNNEL RELEASE Left 11/28/2017   Procedure: CARPAL TUNNEL RELEASE;  Surgeon: Mardee Lynwood SQUIBB, MD;  Location: [facility] ORS;  Service: Orthopedics;  Laterality: Left;   COLONOSCOPY  2015   GANGLION CYST EXCISION Left 11/28/2017   Procedure: REMOVAL GANGLION OF DORSAL WRIST;  Surgeon: Mardee Lynwood SQUIBB, MD;  Location: [facility] ORS;  Service: Orthopedics;  Laterality: Left;   POLYPECTOMY     wrist Left 1987   WRIST SURGERY  02/17/1986   Social History   Occupational History   Not on file  Tobacco Use   Smoking status: Never   Smokeless tobacco: Never  Vaping Use   Vaping status: Never Used  Substance and Sexual Activity   Alcohol use: Not Currently   Drug use: No   Sexual activity: Yes

## 2024-08-31 ENCOUNTER — Encounter: Payer: Self-pay | Admitting: Physician Assistant

## 2024-11-19 ENCOUNTER — Other Ambulatory Visit: Payer: Self-pay | Admitting: Dermatology

## 2024-11-19 DIAGNOSIS — L719 Rosacea, unspecified: Secondary | ICD-10-CM

## 2024-11-19 MED ORDER — DOXYCYCLINE MONOHYDRATE 100 MG PO CAPS: 100.0000 mg | ORAL_CAPSULE | Freq: Every day | ORAL | 1 refills | Status: AC

## 2025-05-18 ENCOUNTER — Ambulatory Visit: Admitting: Dermatology
